# Patient Record
Sex: Female | Born: 1975 | Race: Black or African American | Hispanic: No | Marital: Single | Smoking: Never smoker
Health system: Southern US, Community
[De-identification: ages and names within clinical notes are randomized; demographics above are authoritative.]

## PROBLEM LIST (undated history)

## (undated) ENCOUNTER — Ambulatory Visit (HOSPITAL_COMMUNITY): Admission: EM | Payer: Self-pay

## (undated) DIAGNOSIS — B9562 Methicillin resistant Staphylococcus aureus infection as the cause of diseases classified elsewhere: Secondary | ICD-10-CM

## (undated) DIAGNOSIS — L03116 Cellulitis of left lower limb: Secondary | ICD-10-CM

## (undated) DIAGNOSIS — E7800 Pure hypercholesterolemia, unspecified: Secondary | ICD-10-CM

## (undated) DIAGNOSIS — T7840XA Allergy, unspecified, initial encounter: Secondary | ICD-10-CM

## (undated) DIAGNOSIS — G43909 Migraine, unspecified, not intractable, without status migrainosus: Secondary | ICD-10-CM

## (undated) DIAGNOSIS — R42 Dizziness and giddiness: Secondary | ICD-10-CM

## (undated) DIAGNOSIS — F410 Panic disorder [episodic paroxysmal anxiety] without agoraphobia: Secondary | ICD-10-CM

## (undated) HISTORY — PX: LEEP: SHX91

## (undated) HISTORY — PX: OTHER SURGICAL HISTORY: SHX169

## (undated) HISTORY — DX: Allergy, unspecified, initial encounter: T78.40XA

## (undated) HISTORY — DX: Migraine, unspecified, not intractable, without status migrainosus: G43.909

---

## 1999-05-03 ENCOUNTER — Inpatient Hospital Stay (HOSPITAL_COMMUNITY): Admission: AD | Admit: 1999-05-03 | Discharge: 1999-05-03 | Payer: Self-pay | Admitting: Obstetrics

## 1999-05-04 ENCOUNTER — Inpatient Hospital Stay (HOSPITAL_COMMUNITY): Admission: AD | Admit: 1999-05-04 | Discharge: 1999-05-04 | Payer: Self-pay | Admitting: *Deleted

## 1999-05-04 ENCOUNTER — Encounter: Payer: Self-pay | Admitting: Obstetrics

## 1999-05-04 ENCOUNTER — Encounter (INDEPENDENT_AMBULATORY_CARE_PROVIDER_SITE_OTHER): Payer: Self-pay | Admitting: Specialist

## 2000-09-02 ENCOUNTER — Inpatient Hospital Stay (HOSPITAL_COMMUNITY): Admission: AD | Admit: 2000-09-02 | Discharge: 2000-09-02 | Payer: Self-pay | Admitting: Obstetrics and Gynecology

## 2000-10-25 ENCOUNTER — Inpatient Hospital Stay (HOSPITAL_COMMUNITY): Admission: AD | Admit: 2000-10-25 | Discharge: 2000-10-28 | Payer: Self-pay | Admitting: Obstetrics & Gynecology

## 2000-11-30 ENCOUNTER — Other Ambulatory Visit: Admission: RE | Admit: 2000-11-30 | Discharge: 2000-11-30 | Payer: Self-pay | Admitting: Obstetrics & Gynecology

## 2001-06-09 ENCOUNTER — Encounter (INDEPENDENT_AMBULATORY_CARE_PROVIDER_SITE_OTHER): Payer: Self-pay | Admitting: Specialist

## 2001-06-09 ENCOUNTER — Ambulatory Visit (HOSPITAL_COMMUNITY): Admission: RE | Admit: 2001-06-09 | Discharge: 2001-06-09 | Payer: Self-pay | Admitting: Obstetrics & Gynecology

## 2002-04-12 ENCOUNTER — Other Ambulatory Visit: Admission: RE | Admit: 2002-04-12 | Discharge: 2002-04-12 | Payer: Self-pay | Admitting: Obstetrics & Gynecology

## 2002-09-01 ENCOUNTER — Emergency Department (HOSPITAL_COMMUNITY): Admission: EM | Admit: 2002-09-01 | Discharge: 2002-09-01 | Payer: Self-pay | Admitting: Emergency Medicine

## 2003-05-18 ENCOUNTER — Other Ambulatory Visit: Admission: RE | Admit: 2003-05-18 | Discharge: 2003-05-18 | Payer: Self-pay | Admitting: Obstetrics & Gynecology

## 2004-02-28 ENCOUNTER — Emergency Department (HOSPITAL_COMMUNITY): Admission: EM | Admit: 2004-02-28 | Discharge: 2004-02-28 | Payer: Self-pay | Admitting: Emergency Medicine

## 2006-01-25 ENCOUNTER — Emergency Department (HOSPITAL_COMMUNITY): Admission: EM | Admit: 2006-01-25 | Discharge: 2006-01-25 | Payer: Self-pay | Admitting: Emergency Medicine

## 2006-02-19 ENCOUNTER — Encounter (INDEPENDENT_AMBULATORY_CARE_PROVIDER_SITE_OTHER): Payer: Self-pay | Admitting: Specialist

## 2006-02-19 ENCOUNTER — Ambulatory Visit (HOSPITAL_COMMUNITY): Admission: RE | Admit: 2006-02-19 | Discharge: 2006-02-19 | Payer: Self-pay | Admitting: Obstetrics and Gynecology

## 2007-03-29 ENCOUNTER — Emergency Department (HOSPITAL_COMMUNITY): Admission: EM | Admit: 2007-03-29 | Discharge: 2007-03-29 | Payer: Self-pay | Admitting: Emergency Medicine

## 2007-05-18 ENCOUNTER — Ambulatory Visit: Payer: Self-pay | Admitting: Obstetrics and Gynecology

## 2007-05-18 ENCOUNTER — Other Ambulatory Visit: Admission: RE | Admit: 2007-05-18 | Discharge: 2007-05-18 | Payer: Self-pay | Admitting: Obstetrics and Gynecology

## 2007-09-08 ENCOUNTER — Emergency Department (HOSPITAL_COMMUNITY): Admission: EM | Admit: 2007-09-08 | Discharge: 2007-09-08 | Payer: Self-pay | Admitting: Emergency Medicine

## 2007-09-16 ENCOUNTER — Emergency Department (HOSPITAL_COMMUNITY): Admission: EM | Admit: 2007-09-16 | Discharge: 2007-09-16 | Payer: Self-pay | Admitting: Emergency Medicine

## 2007-09-19 ENCOUNTER — Emergency Department (HOSPITAL_COMMUNITY): Admission: EM | Admit: 2007-09-19 | Discharge: 2007-09-19 | Payer: Self-pay | Admitting: Family Medicine

## 2007-09-30 ENCOUNTER — Ambulatory Visit: Payer: Self-pay | Admitting: Family Medicine

## 2007-10-01 ENCOUNTER — Encounter: Payer: Self-pay | Admitting: Family Medicine

## 2007-11-17 ENCOUNTER — Ambulatory Visit: Payer: Self-pay | Admitting: Obstetrics and Gynecology

## 2007-11-17 ENCOUNTER — Encounter: Payer: Self-pay | Admitting: Family

## 2007-11-17 LAB — CONVERTED CEMR LAB: Yeast Wet Prep HPF POC: NONE SEEN

## 2007-12-08 ENCOUNTER — Ambulatory Visit: Payer: Self-pay | Admitting: Obstetrics & Gynecology

## 2008-08-02 ENCOUNTER — Emergency Department (HOSPITAL_COMMUNITY): Admission: EM | Admit: 2008-08-02 | Discharge: 2008-08-02 | Payer: Self-pay | Admitting: Emergency Medicine

## 2008-08-06 ENCOUNTER — Ambulatory Visit: Payer: Self-pay | Admitting: Internal Medicine

## 2008-08-10 ENCOUNTER — Emergency Department (HOSPITAL_COMMUNITY): Admission: EM | Admit: 2008-08-10 | Discharge: 2008-08-10 | Payer: Self-pay | Admitting: Emergency Medicine

## 2008-10-29 ENCOUNTER — Ambulatory Visit: Payer: Self-pay | Admitting: Internal Medicine

## 2008-10-30 ENCOUNTER — Telehealth (INDEPENDENT_AMBULATORY_CARE_PROVIDER_SITE_OTHER): Payer: Self-pay | Admitting: *Deleted

## 2008-10-31 ENCOUNTER — Ambulatory Visit: Payer: Self-pay | Admitting: Family Medicine

## 2008-11-01 ENCOUNTER — Encounter (INDEPENDENT_AMBULATORY_CARE_PROVIDER_SITE_OTHER): Payer: Self-pay | Admitting: Adult Health

## 2008-11-05 ENCOUNTER — Ambulatory Visit: Payer: Self-pay | Admitting: Internal Medicine

## 2009-02-21 ENCOUNTER — Ambulatory Visit: Payer: Self-pay | Admitting: Internal Medicine

## 2009-03-22 ENCOUNTER — Ambulatory Visit: Payer: Self-pay | Admitting: Family Medicine

## 2009-03-28 ENCOUNTER — Emergency Department (HOSPITAL_COMMUNITY): Admission: EM | Admit: 2009-03-28 | Discharge: 2009-03-28 | Payer: Self-pay | Admitting: Emergency Medicine

## 2009-05-06 ENCOUNTER — Ambulatory Visit: Payer: Self-pay | Admitting: Family Medicine

## 2009-05-06 ENCOUNTER — Encounter (INDEPENDENT_AMBULATORY_CARE_PROVIDER_SITE_OTHER): Payer: Self-pay | Admitting: Adult Health

## 2009-05-06 LAB — CONVERTED CEMR LAB
Basophils Relative: 0 % (ref 0–1)
Eosinophils Absolute: 0.4 10*3/uL (ref 0.0–0.7)
Eosinophils Relative: 5 % (ref 0–5)
HCT: 35.6 % — ABNORMAL LOW (ref 36.0–46.0)
Hemoglobin: 11.3 g/dL — ABNORMAL LOW (ref 12.0–15.0)
Lymphs Abs: 2.6 10*3/uL (ref 0.7–4.0)
MCHC: 31.7 g/dL (ref 30.0–36.0)
Neutro Abs: 4.2 10*3/uL (ref 1.7–7.7)
Neutrophils Relative %: 55 % (ref 43–77)
RDW: 13.4 % (ref 11.5–15.5)

## 2009-05-07 ENCOUNTER — Encounter (INDEPENDENT_AMBULATORY_CARE_PROVIDER_SITE_OTHER): Payer: Self-pay | Admitting: Adult Health

## 2009-05-14 ENCOUNTER — Inpatient Hospital Stay (HOSPITAL_COMMUNITY): Admission: EM | Admit: 2009-05-14 | Discharge: 2009-05-17 | Payer: Self-pay | Admitting: Emergency Medicine

## 2009-05-14 ENCOUNTER — Ambulatory Visit: Payer: Self-pay | Admitting: Internal Medicine

## 2009-05-23 ENCOUNTER — Ambulatory Visit: Payer: Self-pay | Admitting: Family Medicine

## 2009-09-23 ENCOUNTER — Emergency Department (HOSPITAL_COMMUNITY): Admission: EM | Admit: 2009-09-23 | Discharge: 2009-09-23 | Payer: Self-pay | Admitting: Emergency Medicine

## 2009-09-30 ENCOUNTER — Emergency Department (HOSPITAL_COMMUNITY): Admission: EM | Admit: 2009-09-30 | Discharge: 2009-09-30 | Payer: Self-pay | Admitting: Emergency Medicine

## 2009-10-02 ENCOUNTER — Inpatient Hospital Stay (HOSPITAL_COMMUNITY): Admission: EM | Admit: 2009-10-02 | Discharge: 2009-10-04 | Payer: Self-pay | Admitting: Emergency Medicine

## 2009-10-09 ENCOUNTER — Emergency Department (HOSPITAL_COMMUNITY): Admission: EM | Admit: 2009-10-09 | Discharge: 2009-10-09 | Payer: Self-pay | Admitting: Emergency Medicine

## 2010-03-20 LAB — COMPREHENSIVE METABOLIC PANEL
ALT: 13 U/L (ref 0–35)
AST: 17 U/L (ref 0–37)
Alkaline Phosphatase: 43 U/L (ref 39–117)
CO2: 26 mEq/L (ref 19–32)
Chloride: 101 mEq/L (ref 96–112)
GFR calc Af Amer: >60 mL/min (ref >60)
GFR calc non Af Amer: 54 mL/min — ABNORMAL LOW (ref >60)
Total Bilirubin: 0.6 mg/dL (ref 0.3–1.2)
Total Protein: 8 g/dL (ref 6.0–8.3)

## 2010-03-20 LAB — CULTURE, BLOOD (ROUTINE X 2)
Culture  Setup Time: 201109290353
Culture: NO GROWTH

## 2010-03-20 LAB — DIFFERENTIAL
Basophils Absolute: 0 10*3/uL (ref 0.0–0.1)
Basophils Absolute: 0 10*3/uL (ref 0.0–0.1)
Basophils Relative: 0 % (ref 0–1)
Basophils Relative: 0 % (ref 0–1)
Eosinophils Absolute: 0 10*3/uL (ref 0.0–0.7)
Eosinophils Relative: 0 % (ref 0–5)
Eosinophils Relative: 0 % (ref 0–5)
Lymphocytes Relative: 7 % — ABNORMAL LOW (ref 12–46)
Lymphocytes Relative: 8 % — ABNORMAL LOW (ref 12–46)
Monocytes Absolute: 0.1 10*3/uL (ref 0.1–1.0)
Monocytes Relative: 1 % — ABNORMAL LOW (ref 3–12)
Neutro Abs: 11.7 10*3/uL — ABNORMAL HIGH (ref 1.7–7.7)
Neutrophils Relative %: 91 % — ABNORMAL HIGH (ref 43–77)

## 2010-03-20 LAB — BASIC METABOLIC PANEL
Chloride: 100 mEq/L (ref 96–112)
GFR calc non Af Amer: 59 mL/min — ABNORMAL LOW (ref >60)
Potassium: 4.5 mEq/L (ref 3.5–5.1)

## 2010-03-20 LAB — CBC
Hemoglobin: 12.2 g/dL (ref 12.0–15.0)
MCH: 24.8 pg — ABNORMAL LOW (ref 26.0–34.0)
MCHC: 32.7 g/dL (ref 30.0–36.0)
MCV: 76.5 fL — ABNORMAL LOW (ref 78.0–100.0)
RBC: 5.16 MIL/uL — ABNORMAL HIGH (ref 3.87–5.11)
WBC: 13 10*3/uL — ABNORMAL HIGH (ref 4.0–10.5)

## 2010-03-20 LAB — MRSA PCR SCREENING: MRSA by PCR: NEGATIVE

## 2010-03-20 LAB — C1 ESTERASE INHIBITOR: C1INH SerPl-mCnc: 17 mg/dL (ref 11–26)

## 2010-03-25 LAB — BASIC METABOLIC PANEL
CO2: 26 mEq/L (ref 19–32)
CO2: 27 mEq/L (ref 19–32)
Calcium: 8.9 mg/dL (ref 8.4–10.5)
Chloride: 106 mEq/L (ref 96–112)
Creatinine, Ser: 0.85 mg/dL (ref 0.4–1.2)
Creatinine, Ser: 1.01 mg/dL (ref 0.4–1.2)
GFR calc Af Amer: >60 mL/min (ref >60)
GFR calc non Af Amer: >60 mL/min (ref >60)
Glucose, Bld: 95 mg/dL (ref 70–99)
Potassium: 4.1 mEq/L (ref 3.5–5.1)
Sodium: 135 mEq/L (ref 135–145)
Sodium: 139 mEq/L (ref 135–145)

## 2010-03-25 LAB — DIFFERENTIAL
Basophils Relative: 0 % (ref 0–1)
Lymphocytes Relative: 28 % (ref 12–46)
Monocytes Relative: 7 % (ref 3–12)
Neutro Abs: 3.8 10*3/uL (ref 1.7–7.7)

## 2010-03-25 LAB — CBC
Hemoglobin: 10.8 g/dL — ABNORMAL LOW (ref 12.0–15.0)
MCHC: 31.8 g/dL (ref 30.0–36.0)
MCV: 76.8 fL — ABNORMAL LOW (ref 78.0–100.0)
MCV: 77.9 fL — ABNORMAL LOW (ref 78.0–100.0)
Platelets: 253 10*3/uL (ref 150–400)
Platelets: 306 10*3/uL (ref 150–400)
RBC: 4.42 MIL/uL (ref 3.87–5.11)
RDW: 13.8 % (ref 11.5–15.5)
WBC: 5.4 10*3/uL (ref 4.0–10.5)

## 2010-03-25 LAB — POCT I-STAT, CHEM 8
Calcium, Ion: 0.98 mmol/L — ABNORMAL LOW (ref 1.12–1.32)
Chloride: 107 mEq/L (ref 96–112)
HCT: 44 % (ref 36.0–46.0)
Hemoglobin: 15 g/dL (ref 12.0–15.0)
Potassium: 5 mEq/L (ref 3.5–5.1)
TCO2: 20 mmol/L (ref 0–100)

## 2010-03-25 LAB — WOUND CULTURE

## 2010-03-28 DIAGNOSIS — G4700 Insomnia, unspecified: Secondary | ICD-10-CM | POA: Insufficient documentation

## 2010-04-12 LAB — COMPREHENSIVE METABOLIC PANEL
ALT: 12 U/L (ref 0–35)
Alkaline Phosphatase: 44 U/L (ref 39–117)
BUN: 5 mg/dL — ABNORMAL LOW (ref 6–23)
CO2: 24 mEq/L (ref 19–32)
Chloride: 106 mEq/L (ref 96–112)
Glucose, Bld: 115 mg/dL — ABNORMAL HIGH (ref 70–99)
Potassium: 3.7 mEq/L (ref 3.5–5.1)
Sodium: 137 mEq/L (ref 135–145)
Total Bilirubin: 0.7 mg/dL (ref 0.3–1.2)
Total Protein: 7.6 g/dL (ref 6.0–8.3)

## 2010-04-12 LAB — DIFFERENTIAL
Basophils Absolute: 0 10*3/uL (ref 0.0–0.1)
Basophils Relative: 1 % (ref 0–1)
Eosinophils Absolute: 0.1 10*3/uL (ref 0.0–0.7)
Lymphs Abs: 2.4 10*3/uL (ref 0.7–4.0)
Monocytes Relative: 5 % (ref 3–12)
Neutro Abs: 4 10*3/uL (ref 1.7–7.7)
Neutrophils Relative %: 58 % (ref 43–77)

## 2010-04-12 LAB — CBC
MCV: 76.6 fL — ABNORMAL LOW (ref 78.0–100.0)
RBC: 4.79 MIL/uL (ref 3.87–5.11)
RDW: 14.1 % (ref 11.5–15.5)
WBC: 6.9 10*3/uL (ref 4.0–10.5)

## 2010-04-12 LAB — POCT CARDIAC MARKERS: Myoglobin, poc: 44 ng/mL (ref 12–200)

## 2010-05-10 ENCOUNTER — Emergency Department (HOSPITAL_BASED_OUTPATIENT_CLINIC_OR_DEPARTMENT_OTHER)
Admission: EM | Admit: 2010-05-10 | Discharge: 2010-05-10 | Disposition: A | Discharge status: Home / Self Care | Payer: Medicaid Other | Attending: Emergency Medicine | Admitting: Emergency Medicine

## 2010-05-10 DIAGNOSIS — L02619 Cutaneous abscess of unspecified foot: Secondary | ICD-10-CM | POA: Insufficient documentation

## 2010-05-10 DIAGNOSIS — M79609 Pain in unspecified limb: Secondary | ICD-10-CM | POA: Insufficient documentation

## 2010-05-10 DIAGNOSIS — L03119 Cellulitis of unspecified part of limb: Secondary | ICD-10-CM | POA: Insufficient documentation

## 2010-05-20 NOTE — Group Therapy Note (Signed)
Jasmine Jennings, Jasmine Jennings              ACCOUNT NO.:  [id number]   MEDICAL RECORD NO.:  [id number]          PATIENT TYPE:  WOC   LOCATION:  [facility]                   FACILITY:  [facility]   PHYSICIAN:  Sid Falcon, CNM  DATE OF BIRTH:  1975/12/09   DATE OF SERVICE:  11/17/2007                                  CLINIC NOTE   The patient is here for followup after abnormal Pap smear in the past,  was seen at [facility] ob[facility]gyn, had a LEEP procedure done in 2008 with a  followup high-grade squamous intraepithelial lesion, she had a high-  grade squamous intraepithelial lesion in early 2008 followed by a LEEP  procedure.  The patient then went on to have a conization on February  2008.   DICTATION ENDS AT THIS POINT      Sid Falcon, [place]     WM/MEDQ  D:  11/17/2007  T:  11/17/2007  Job:  [id number]

## 2010-05-20 NOTE — Group Therapy Note (Signed)
Jasmine Jennings, Jasmine Jennings              ACCOUNT NO.:  [id number]   MEDICAL RECORD NO.:  [id number]          PATIENT TYPE:  WOC   LOCATION:  [facility]                   FACILITY:  [facility]   PHYSICIAN:  Sid Falcon, CNM  DATE OF BIRTH:  Feb 27, 1975   DATE OF SERVICE:  11/17/2007                                  CLINIC NOTE   The patient was formerly seen at  [facility].  The patient  had a canonization done in February 22, 2006 for high-grade squamous  intra-epithelial lesion.  The patient then followed up and had a repeat  Pap in February 2009.  It was ASCUS with a positive HPV reflex.  The  patient was then seen at our [facility] and she had a  colposcopy on May 18, 2007.  The colpo result came back as  unsatisfactory.  The patient was informed to come back for a repeat Pap  smear in 6 months.  The patient is here today for that repeat Pap smear.  Denying any additional problems or concerns.  The patient is receiving  Depo-Provera for birth control method which is due tomorrow and would  like to get it here if possible.   EXAM:  The cervix was easy to find.  Cervical os was patent.  There was  some abnormal scarring in the anterior portion of the cervix at  approximately 12 o'clock.  Pap smear was obtained without difficulty,  will be sent to lab.  In addition, there was some white frothy appearing  discharge.  A Wet prep was collected and GC and Chlamydia will be run  along with a Pap smear that was obtained today.   ASSESSMENT:  Abnormal Pap smear with unsatisfactory colposcopy.   PLAN:  A Pap smear to laboratory with wet prep chlamydia and gonorrhea.  The patient will call the health department today to obtain verification  of the last Depo injection and we will proceed with injection if dates  are accurate.  The patient will follow up in 2 weeks for results.      Sid Falcon, CNM     WM/MEDQ  D:  11/17/2007  T:  11/17/2007  Job:  [id number]

## 2010-05-23 NOTE — Op Note (Signed)
NAMEKABELLA, Jasmine Jennings              ACCOUNT NO.:  [id number]   MEDICAL RECORD NO.:  [id number]          PATIENT TYPE:  AMB   LOCATION:  [facility]                           [facility]ILITY:  [facility]   PHYSICIAN:  Naima A. Dillard, M.D. DATE OF BIRTH:  Mar 10, 1975   DATE OF PROCEDURE:  02/19/2006  DATE OF DISCHARGE:                               OPERATIVE REPORT   PREOPERATIVE DIAGNOSIS:  High grade squamous epithelial lesion on Pap  with colposcopic biopsy that was negative.   POSTOPERATIVE DIAGNOSIS:  High grade squamous epithelial lesion on Pap  with colposcopic biopsy that was negative.   PROCEDURE:  Loop electrosurgical excision procedure.   SURGEON:  Naima A. Dillard, M.D.   ASSISTANT:  None.   ANESTHESIA:  MAC.   SPECIMENS:  LEEP specimen.   ESTIMATED BLOOD LOSS:  Minimal.   COMPLICATIONS:  None.   DISPOSITION:  The patient went to the PACU in stable condition.   PROCEDURE IN DETAIL:  The patient was taken to the operating room where  she was given anesthesia, prepped and draped in a normal sterile  fashion.  A bivalve speculum was placed into the vagina.  The anterior  lip of the cervix was grasped with a single tooth tenaculum.  20 mL of  2% Xylocaine plain was placed for cervical block.  Lugol's was placed on  the cervix, there were no lightened areas.  A small loop was used and  LEEP was done on 60 cut.  The cervical bed was made hemostatic with  Bovie cautery and Lugol's.  All instruments were removed from the  vagina.  The tenaculum site was noted to be hemostatic.  Sponge, lap,  and needle counts were correct.  The patient went to the recovery room  in stable condition.      Naima A. Normand Sloop, M.D.  Electronically Signed     NAD/MEDQ  D:  02/19/2006  T:  02/19/2006  Job:  [id number]

## 2010-05-23 NOTE — Op Note (Signed)
[facility]  Patient:    Jasmine Jennings, Jasmine Jennings Visit Number: [id number] MRN: [id number]          Service Type: DSU Location: [facility] Attending Physician:  Genia Del Dictated by:   Genia Del, M.D. Proc. Date: 06/09/01 Admit Date:  06/09/2001 Discharge Date: 06/09/2001                             Operative Report  DATE OF BIRTH:                [birth year]  PREOPERATIVE DIAGNOSIS:       7+ weeks gestation, nondesired.  POSTOPERATIVE DIAGNOSIS:      7+ weeks gestation, nondesired.  OPERATION:                    Dilatation and evacuation.  SURGEON:                      Genia Del, M.D.  ANESTHESIOLOGIST:             Dr. Jean Rosenthal.   DESCRIPTION OF PROCEDURE:     Under MAC analgesia, the patient is in lithotomy position. She is prepped with Hibiclens on the suprapubic, vulvar, and vaginal areas and draped as usual. The bladder is catheterized. The vaginal exam reveals a very anteverted uterus, corresponding to seven to eight weeks, mobile, no adnexal mass. The cervix is long and closed. The speculum is introduced. We noticed increased vaginal secretions compatible with Candida vaginitis. The prep is completed. With the speculum in place, we then proceeded with paracervical block with lidocaine 1% 20 cc total at four and eight oclock. The anterior lip of the cervix is grasped with the tenaculum. Dilatation of the cervix with Hegar dilators up to #33 without difficulty. We then used a #8 curved curette for suction. The intrauterine cavity is suctioned. Products of conception corresponding to about seven to eight weeks are brought out and sent to pathology. We then used a sharp curette to curettage all surfaces of the intrauterine cavity. The uterine sound is heard all over. We then used the suction curette. No more products of conception are brought back. Hemostasis is adequate. We removed the instruments. Hemostasis is  completed on the anterior lip of the cervix at the site of the tenaculum with silver nitrate. We then removed the speculum. The uterus is well contracted. Estimated blood loss was less than 50 cc. No complication occurred. The patients blood group is O-positive. The patient is brought to the recovery room in good status. Dictated by:   Genia Del, M.D. Attending Physician:  Genia Del DD:  06/09/01 TD:  06/13/01 Job: [id number]

## 2010-05-23 NOTE — H&P (Signed)
Jasmine Jennings, Jasmine Jennings              ACCOUNT NO.:  [id number]   MEDICAL RECORD NO.:  [id number]          PATIENT TYPE:  AMB   LOCATION:  [facility]                           [facility]   PHYSICIAN:  Naima A. Dillard, M.D. DATE OF BIRTH:  01/15/1975   DATE OF ADMISSION:  DATE OF DISCHARGE:                              HISTORY & PHYSICAL   CHIEF COMPLAINT:  High grade squamous epithelial lesion on Pap smear  with negative colposcopy biopsy, patient for LEEP.   The patient is a 35 year old [place] American female, gravida 1, para 1,  who presented to me on December 28, 2005, for colposcopy with a high  grade squamous epithelial lesion on her Pap smear.  The patient was  without complaints.  She has no past significant history.   ALLERGIES:  No known drug allergies.   SOCIAL HISTORY:  Negative for tobacco, alcohol, or drug use.   FAMILY HISTORY:  No family history of GYN cancer.   PAST SURGICAL HISTORY:  Unremarkable.   PAST OB HISTORY:  Significant for a vaginal delivery x1 without any  complications.   PHYSICAL EXAMINATION:  VITAL SIGNS:  Blood pressure 100/60, weight 135.  HEENT:  Pupils are equal.  Hearing is normal.  Throat is clear.  LUNGS:  Clear to auscultation bilaterally.  HEART:  Regular rate and rhythm.  BACK:  No CVA tenderness.  ABDOMEN:  Nontender without any  masses or organomegaly.  EXTREMITIES:  No cyanosis, clubbing, or edema.  GU:  Vaginal exam is within normal limits.  Cervix is nontender without  any gross lesions.  She has some acetowhite changes seen at 12 and 6  o'clock.  Uterus is normal shape and size, mobile, nontender.  Adnexa  has no masses and nontender.   LABORATORY DATA:  Cervical biopsy at 12 o'clock was significant for  acute on chronic cervicitis.  Cervical biopsy at 6 was benign squamous  mucosa.  Endocervical curettings was benign.   I reviewed the Pap smear with Dr. Lorin Picket who felt like it was definitely  a high grade lesion seen on the Pap  smear.   ASSESSMENT:  Discrepancy between Pap smear and colposcopic biopsy.   PLAN:  LEEP.  The patient understands the risks are but not limited to  bleeding, infection, cervical stenosis and cervical incompetence during  pregnancy.  The patient agrees with the procedure.      Naima A. Normand Sloop, M.D.  Electronically Signed     NAD/MEDQ  D:  02/18/2006  T:  02/18/2006  Job:  [id number]

## 2010-05-23 NOTE — H&P (Signed)
[facility]  Patient:    Jasmine Jennings, Jasmine Jennings Visit Number: [id number] MRN: [id number]          Service Type: OBS Location: 910A [id number] [id number] Attending Physician:  Lenoard Aden Dictated by:   Lenoard Aden, M.D. Admit Date:  10/25/2000 Discharge Date: 10/28/2000   CC:         [facility]                         History and Physical  CHIEF COMPLAINT:              Oligohydramnios and intrauterine growth retardation, for induction.  HISTORY OF PRESENT ILLNESS:   This is a 35 year old African-American female, G2 P2, EDD November 11, 2000, at 37+ weeks, for induction due to worsening oligohydramnios and asymmetric IUGR.  PAST MEDICAL HISTORY:         1. Migraines.                               2. History of SAB without D&C in May 2001.  FAMILY HISTORY:               Insulin-dependent diabetes.  No other contributory medical history.  SOCIAL HISTORY:               Noncontributory.  Nonsmoker, nondrinker.  Denies domestic or physical violence.  PRENATAL LABORATORY DATA:     Blood type O-positive.  RH antibody negative. Rubella immune.  Hepatitis B surface antigen negative.  HIV nonreactive.  VDRL negative.  GBS was not performed in reviewing of records.  PREGNANCY HISTORY:            Complicated by preterm cervical change, stable on bed rest, and IUGR as noted.  PHYSICAL EXAMINATION:  GENERAL:                      She is a well-developed, well-nourished black female, in no apparent distress.  HEENT:                        Normal.  LUNGS:                        Clear.  HEART:                        Regular rate and rhythm.  ABDOMEN:                      Soft, gravid, nontender.  Estimated fetal weight 5-1/2 pounds.  PELVIC:                       Cervix per nursing fingertip, 60%, -1. Posterior Cervidil placed.  NST reactive.  Contractions mild every four to six minutes.  IMPRESSION:                   1. Patient with intrauterine  pregnancy.                               2. Group B streptococci positive.  3. Asymmetric intrauterine growth retardation                                  with oligohydramnios.  PLAN:                         Proceed with cervical ripening and induction. Is on prophylaxis antepartum. Dictated by:   Lenoard Aden, M.D. Attending Physician:  Lenoard Aden DD:  10/25/00 TD:  10/25/00 Job: [id number]

## 2010-06-08 IMAGING — CR DG TIBIA/FIBULA 2V*R*
4 series · 4 of 4 positions shown · non-contrast
Comparison: None.

CLINICAL DATA: Cellulitis.

RIGHT TIBIA AND FIBULA - 2 VIEW

[t tib/fib ap right (1 of 2)]
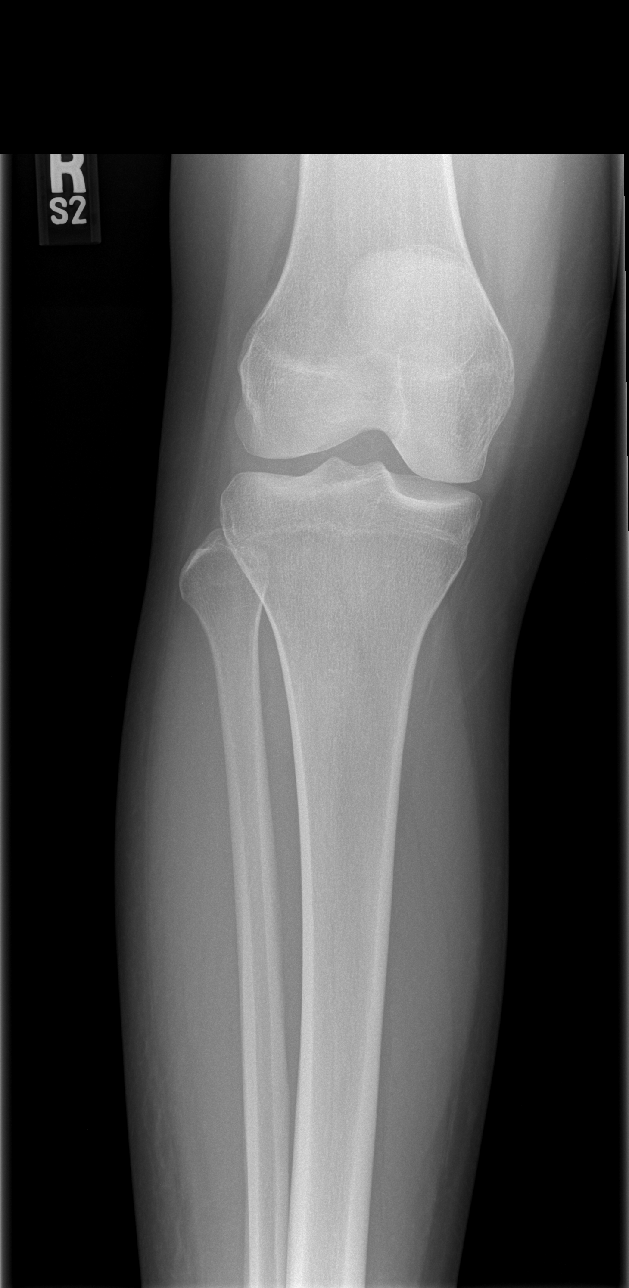

[t tib/fib ap right (2 of 2)]
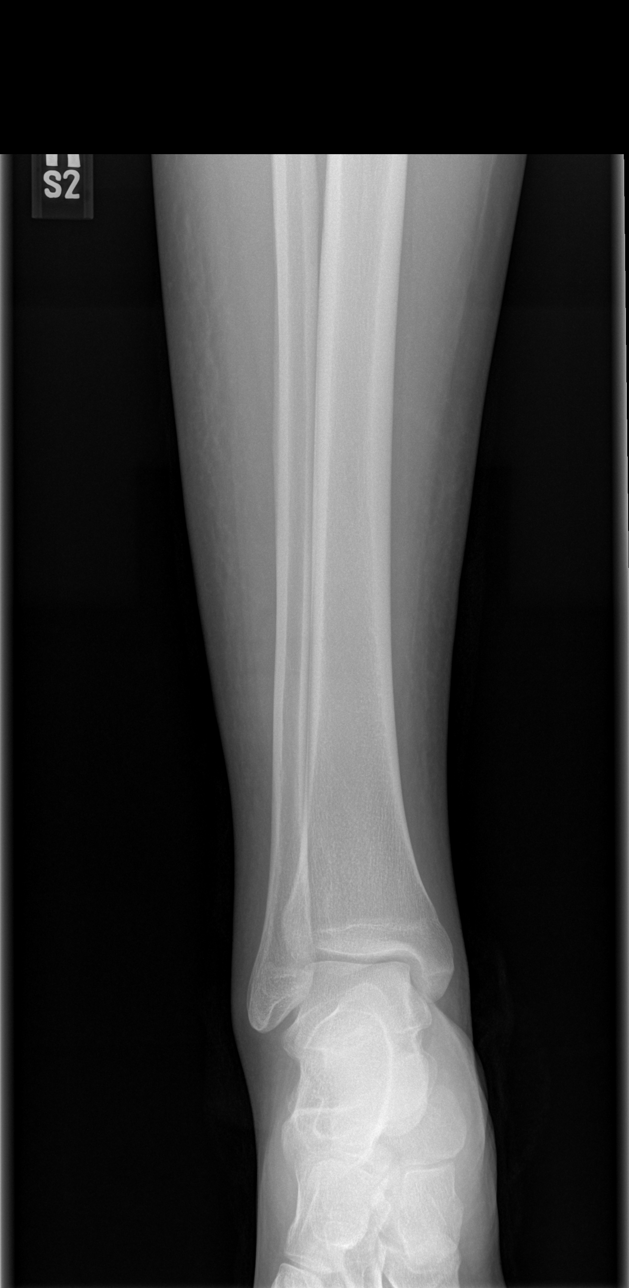

[t tib/fib lat right (1 of 2)]
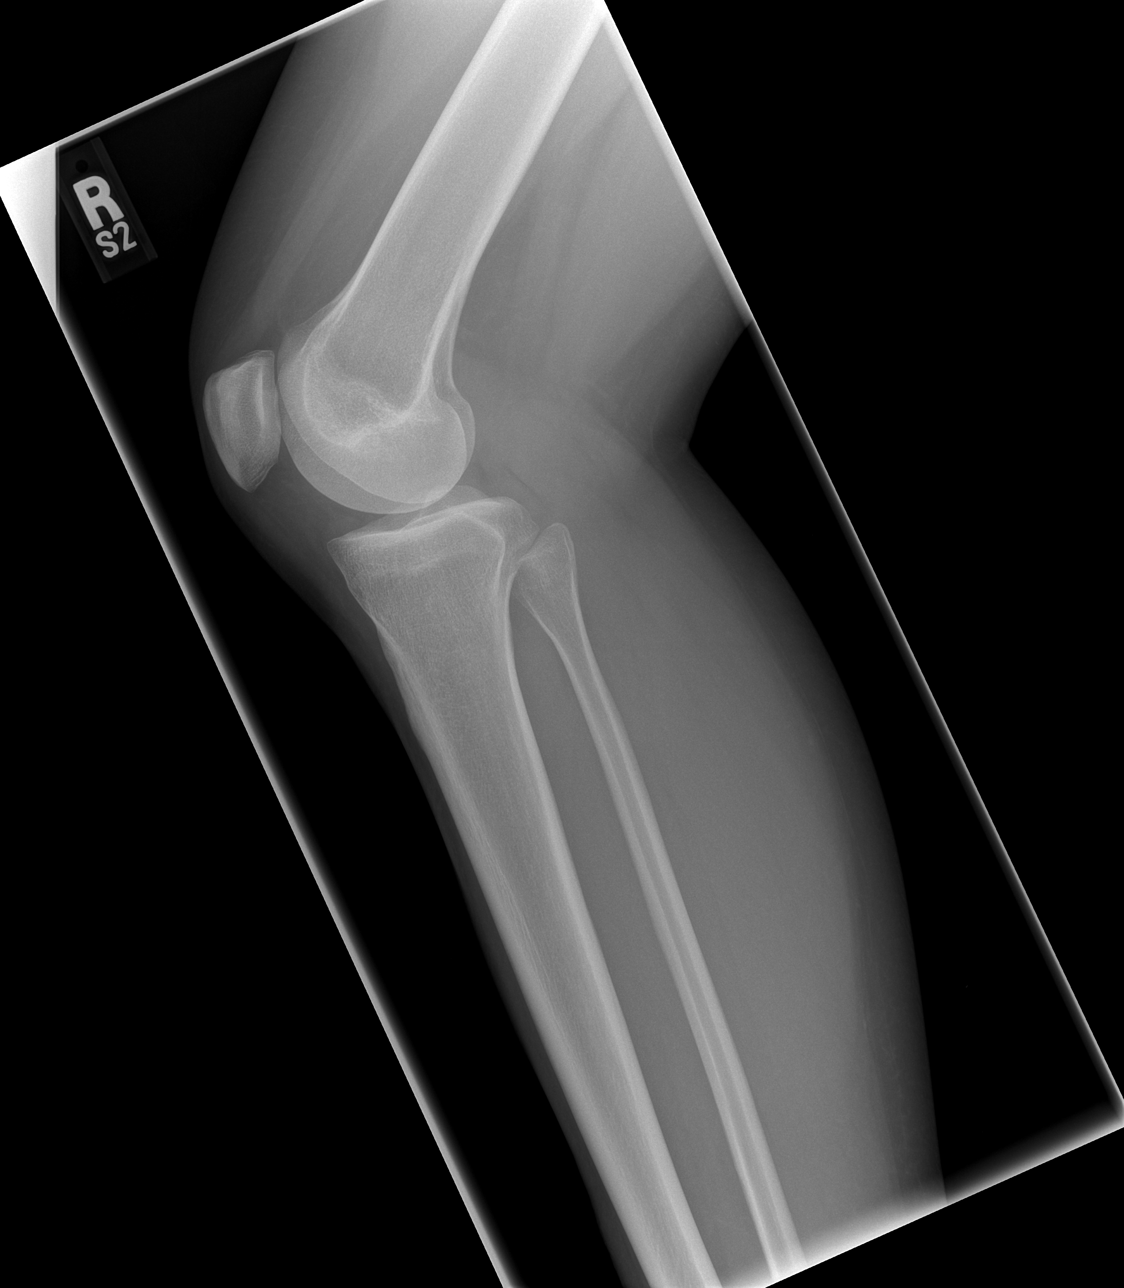

[t tib/fib lat right (2 of 2)]
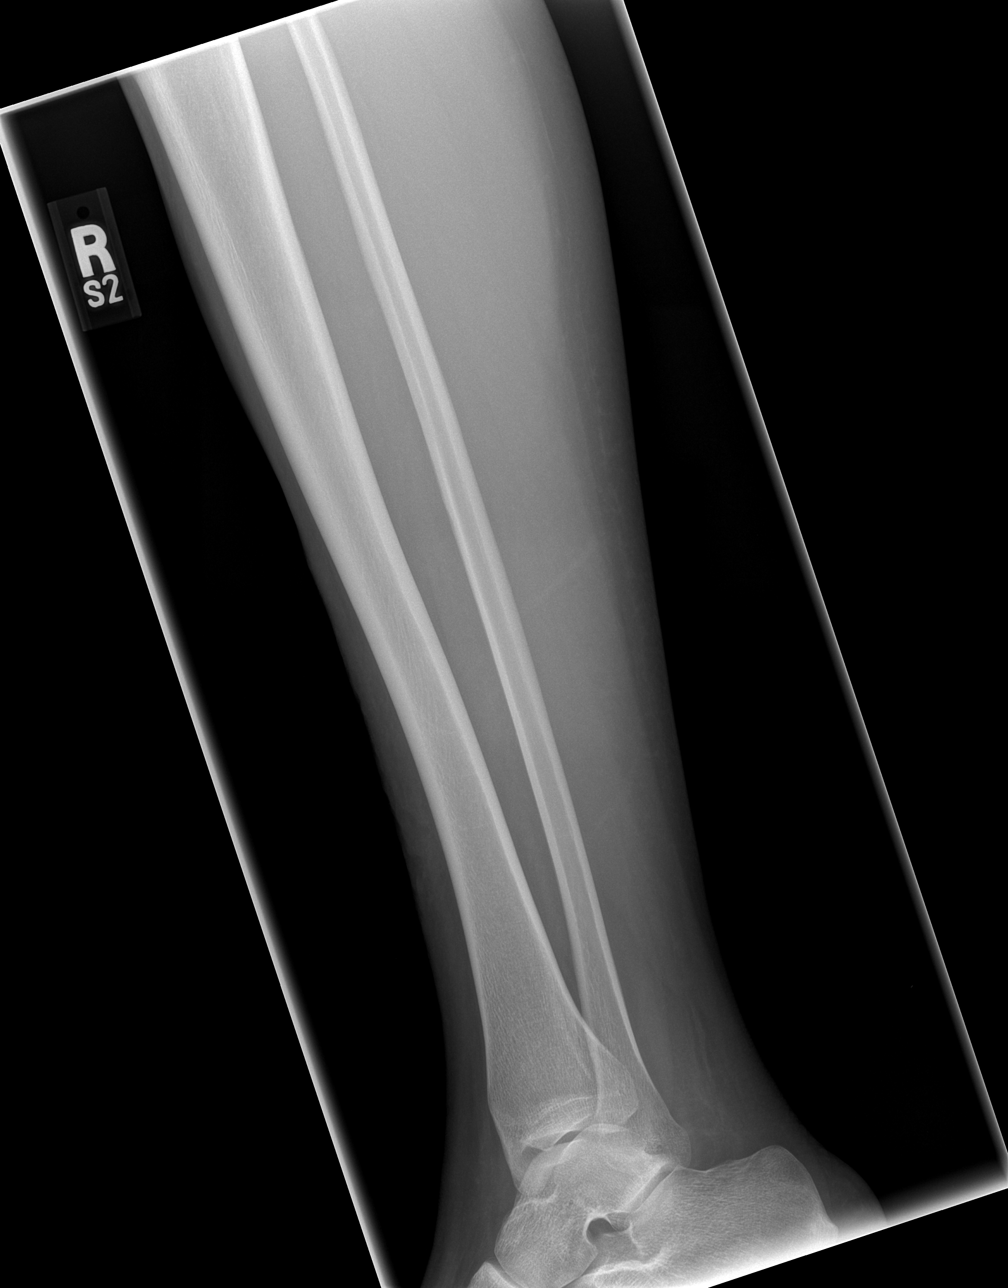

[4 of 4 positions shown; findings below may reference images not displayed]

FINDINGS: No acute bony abnormality.  No periosteal reaction.
Reticulated like appearance of the subcutaneous tissues mainly in
the lateral aspect of the lower leg.  This may be due to the
cellulitis/edema.
IMPRESSION: Findings consistent with cellulitis.  No bony abnormality.

## 2010-09-12 ENCOUNTER — Emergency Department (HOSPITAL_COMMUNITY)
Admission: EM | Admit: 2010-09-12 | Discharge: 2010-09-12 | Disposition: A | Discharge status: Home / Self Care | Payer: Medicaid Other | Attending: Emergency Medicine | Admitting: Emergency Medicine

## 2010-09-12 DIAGNOSIS — L03019 Cellulitis of unspecified finger: Secondary | ICD-10-CM | POA: Insufficient documentation

## 2010-09-12 DIAGNOSIS — Z23 Encounter for immunization: Secondary | ICD-10-CM | POA: Insufficient documentation

## 2011-06-18 ENCOUNTER — Other Ambulatory Visit: Payer: Self-pay | Admitting: Family Medicine

## 2011-06-18 DIAGNOSIS — N644 Mastodynia: Secondary | ICD-10-CM

## 2011-06-30 DIAGNOSIS — IMO0002 Reserved for concepts with insufficient information to code with codable children: Secondary | ICD-10-CM | POA: Insufficient documentation

## 2011-08-02 ENCOUNTER — Encounter (HOSPITAL_COMMUNITY): Payer: Self-pay | Admitting: *Deleted

## 2011-08-02 DIAGNOSIS — E860 Dehydration: Secondary | ICD-10-CM | POA: Insufficient documentation

## 2011-08-02 DIAGNOSIS — T675XXA Heat exhaustion, unspecified, initial encounter: Secondary | ICD-10-CM | POA: Insufficient documentation

## 2011-08-02 DIAGNOSIS — X30XXXA Exposure to excessive natural heat, initial encounter: Secondary | ICD-10-CM | POA: Insufficient documentation

## 2011-08-02 DIAGNOSIS — Y92009 Unspecified place in unspecified non-institutional (private) residence as the place of occurrence of the external cause: Secondary | ICD-10-CM | POA: Insufficient documentation

## 2011-08-02 LAB — POCT I-STAT, CHEM 8
BUN: <3 mg/dL — ABNORMAL LOW (ref 6–23)
Calcium, Ion: 1.18 mmol/L (ref 1.12–1.23)
Chloride: 105 mEq/L (ref 96–112)
Creatinine, Ser: 1.1 mg/dL (ref 0.50–1.10)
Glucose, Bld: 108 mg/dL — ABNORMAL HIGH (ref 70–99)

## 2011-08-02 LAB — CBC WITH DIFFERENTIAL/PLATELET
Eosinophils Absolute: 0.2 10*3/uL (ref 0.0–0.7)
Eosinophils Relative: 3 % (ref 0–5)
HCT: 36.5 % (ref 36.0–46.0)
Lymphocytes Relative: 41 % (ref 12–46)
Lymphs Abs: 2.2 10*3/uL (ref 0.7–4.0)
MCH: 24 pg — ABNORMAL LOW (ref 26.0–34.0)
MCV: 73 fL — ABNORMAL LOW (ref 78.0–100.0)
Monocytes Absolute: 0.4 10*3/uL (ref 0.1–1.0)
Monocytes Relative: 8 % (ref 3–12)
RBC: 5 MIL/uL (ref 3.87–5.11)
WBC: 5.3 10*3/uL (ref 4.0–10.5)

## 2011-08-02 LAB — URINE MICROSCOPIC-ADD ON

## 2011-08-02 LAB — URINALYSIS, ROUTINE W REFLEX MICROSCOPIC
Bilirubin Urine: NEGATIVE
Glucose, UA: NEGATIVE mg/dL
Protein, ur: NEGATIVE mg/dL
Specific Gravity, Urine: 1.01 (ref 1.005–1.030)

## 2011-08-02 NOTE — ED Notes (Signed)
C/o nv, weak, bilateral ear fullness & dizziness. Also reports decreased PO intake(liquid and food). Last void today ("normal"). Sx onset yesterday. Reports having been moving into a new town home which has no AC. "Concerned about heat exhaustion". VSS/WNL.

## 2011-08-03 ENCOUNTER — Emergency Department (HOSPITAL_COMMUNITY)
Admission: EM | Admit: 2011-08-03 | Discharge: 2011-08-03 | Disposition: A | Discharge status: Home / Self Care | Payer: 59 | Attending: Emergency Medicine | Admitting: Emergency Medicine

## 2011-08-03 DIAGNOSIS — T679XXA Effect of heat and light, unspecified, initial encounter: Secondary | ICD-10-CM

## 2011-08-03 DIAGNOSIS — E860 Dehydration: Secondary | ICD-10-CM

## 2011-08-03 HISTORY — DX: Methicillin resistant Staphylococcus aureus infection as the cause of diseases classified elsewhere: B95.62

## 2011-08-03 HISTORY — DX: Cellulitis of left lower limb: L03.116

## 2011-08-03 MED ORDER — SODIUM CHLORIDE 0.9 % IV BOLUS (SEPSIS)
1000.0000 mL | Freq: Once | INTRAVENOUS | Status: AC
  Administered 2011-08-03: 1000 mL via INTRAVENOUS

## 2011-08-03 MED ORDER — ONDANSETRON HCL 4 MG/2ML IJ SOLN
4.0000 mg | Freq: Once | INTRAMUSCULAR | Status: AC
  Administered 2011-08-03: 4 mg via INTRAVENOUS
  Filled 2011-08-03: qty 2

## 2011-08-03 MED ORDER — ONDANSETRON HCL 4 MG PO TABS: 4.0000 mg | ORAL_TABLET | Freq: Four times a day (QID) | ORAL | Status: AC

## 2011-08-03 NOTE — ED Provider Notes (Signed)
History     CSN: [id number]  Arrival date & time 08/02/11  2149   First MD Initiated Contact with Patient 08/03/11 0035      Chief Complaint  Patient presents with  . Emesis    (Consider location/radiation/quality/duration/timing/severity/associated sxs/prior treatment) HPI 36 year-old female presents to emergency room complaining of 2 days of nausea one day of vomiting, headache and generalized weakness. Patient noted a new home over the weekend, and has been without air conditioning. She has been drinking fluids but streaking more sodas than water. She is unclear should her urine has changed color. She denies any abdominal pain, fever. Sr. has been staying with her as a generalized weakness. Patient's concern for heat exhaustion. She recently diagnosed with MRSA, and has been unable to take her Bactrim for last 2 days due to nausea. She also complaining of fullness in her ears and slight vertigo, has had history of this before. No ear pain, no drainage. No recent URIs. Past Medical History  Diagnosis Date  . MRSA cellulitis of left foot     L foot    Past Surgical History  Procedure Date  . Leep     Family History  Problem Relation Age of Onset  . Hyperlipidemia Mother   . Diabetes Other     History  Substance Use Topics  . Smoking status: Never Smoker   . Smokeless tobacco: Not on file  . Alcohol Use: No    OB History    Grav Para Term Preterm Abortions TAB SAB Ect Mult Living                  Review of Systems  All other systems reviewed and are negative.  other than listed in HPI  Allergies  Doxycycline; Latex; Shellfish allergy; Yeast-related products; and Ciprofloxacin  Home Medications   Current Outpatient Rx  Name Route Sig Dispense Refill  . ALPRAZOLAM 0.5 MG PO TABS Oral Take 0.5 mg by mouth at bedtime as needed. For sleep and anxiety    . SULFAMETHOXAZOLE-TRIMETHOPRIM 400-80 MG PO TABS Oral Take 2 tablets by mouth 2 (two) times daily. For MRSA  infection    . ZOLPIDEM TARTRATE 10 MG PO TABS Oral Take 5 mg by mouth at bedtime as needed. For sleep      BP 124/69  Pulse 91  Temp 98.3 F (36.8 C) (Oral)  Resp 20  SpO2 100%  LMP 07/31/2011  Physical Exam  Nursing note and vitals reviewed. Constitutional: She is oriented to person, place, and time. She appears well-developed and well-nourished.  HENT:  Head: Normocephalic and atraumatic.  Right Ear: External ear normal.  Left Ear: External ear normal.  Nose: Nose normal.  Mouth/Throat: Oropharynx is clear and moist.  Eyes: Conjunctivae and EOM are normal. Pupils are equal, round, and reactive to light.  Neck: Normal range of motion. Neck supple. No JVD present. No tracheal deviation present. No thyromegaly present.  Cardiovascular: Normal rate, regular rhythm, normal heart sounds and intact distal pulses.  Exam reveals no gallop and no friction rub.   No murmur heard. Pulmonary/Chest: Effort normal and breath sounds normal. No stridor. No respiratory distress. She has no wheezes. She has no rales. She exhibits no tenderness.  Abdominal: Soft. Bowel sounds are normal. She exhibits no distension and no mass. There is no tenderness. There is no rebound and no guarding.  Musculoskeletal: Normal range of motion. She exhibits no edema and no tenderness.  Lymphadenopathy:    She has no  cervical adenopathy.  Neurological: She is alert and oriented to person, place, and time. She exhibits normal muscle tone. Coordination normal.  Skin: Skin is dry. No rash noted. No erythema. No pallor.  Psychiatric: She has a normal mood and affect. Her behavior is normal. Judgment and thought content normal.    ED Course  Procedures (including critical care time)  Labs Reviewed  URINALYSIS, ROUTINE W REFLEX MICROSCOPIC - Abnormal; Notable for the following:    APPearance CLOUDY (*)     Hgb urine dipstick LARGE (*)     Ketones, ur >80 (*)     Leukocytes, UA SMALL (*)     All other components  within normal limits  CBC WITH DIFFERENTIAL - Abnormal; Notable for the following:    MCV 73.0 (*)     MCH 24.0 (*)     All other components within normal limits  POCT I-STAT, CHEM 8 - Abnormal; Notable for the following:    BUN <3 (*)     Glucose, Bld 108 (*)     All other components within normal limits  URINE MICROSCOPIC-ADD ON - Abnormal; Notable for the following:    Squamous Epithelial / LPF FEW (*)     Bacteria, UA MANY (*)     All other components within normal limits  POCT PREGNANCY, URINE     1. Dehydration   2. Heat effect       MDM  36 year old female with probable heat exhaustion. Patient noted to have significant ketones in her urine, lab work otherwise unremarkable. She is currently on her menstrual period. Patient have Zofran and IV fluids. We'll reassess after fluids to see if she is able to take by mouth fluids and be able to go home to        Olivia Mackie, MD 08/04/11 [phone]

## 2011-08-10 ENCOUNTER — Other Ambulatory Visit: Payer: Self-pay | Admitting: Obstetrics and Gynecology

## 2011-10-07 DIAGNOSIS — R8769 Abnormal cytological findings in specimens from other female genital organs: Secondary | ICD-10-CM | POA: Insufficient documentation

## 2011-10-12 ENCOUNTER — Ambulatory Visit: Payer: 59

## 2011-10-12 ENCOUNTER — Ambulatory Visit (INDEPENDENT_AMBULATORY_CARE_PROVIDER_SITE_OTHER): Payer: 59 | Admitting: Family Medicine

## 2011-10-12 VITALS — BP 110/66 | HR 90 | Temp 98.2°F | Resp 16 | Ht 68.0 in | Wt 165.0 lb

## 2011-10-12 DIAGNOSIS — R0781 Pleurodynia: Secondary | ICD-10-CM

## 2011-10-12 DIAGNOSIS — R079 Chest pain, unspecified: Secondary | ICD-10-CM

## 2011-10-12 DIAGNOSIS — M545 Low back pain, unspecified: Secondary | ICD-10-CM

## 2011-10-12 MED ORDER — TRAMADOL HCL 50 MG PO TABS: 50.0000 mg | ORAL_TABLET | Freq: Three times a day (TID) | ORAL | Status: DC | PRN

## 2011-10-12 MED ORDER — DICLOFENAC SODIUM 75 MG PO TBEC: 75.0000 mg | DELAYED_RELEASE_TABLET | Freq: Two times a day (BID) | ORAL | Status: DC

## 2011-10-12 MED ORDER — METAXALONE 800 MG PO TABS: ORAL_TABLET | ORAL | Status: DC

## 2011-10-12 NOTE — Progress Notes (Signed)
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 He does not know of any specific injury to her back, however for the past 2-3 weeks she's been having pretty severe pain in her left low back. It hurts her when she lays down it hurts when she rolls over it hurts her when she needs doing things upright. Her daughter, and there were swollen this morning. She has been her primary care doctor and gotten a urinalysis which was concentrated sure that she was on the dry side but otherwise no evidence of urinary tract infection. Has had some upper back and shoulder muscle spasms, but has not had this kind of the low back and mid back. Problem.  Objective: Pleasant alert lady in no acute distress which he says, but exclaims out with the pain whenever he moves around and was flexion is somewhat limited, as is tension and side to side tilt by the pain. He's very tender left back from the lower ribs down to the lateral lobe back just above the left lateral iliac crest area she is tight. Straight leg raise test negative. Abdomen benign.  Assessment: Low and midback pain, primarily left.  Plan: X-ray of the lower ribs to make sure there is a then decide treatment. She's been taking ibuprofen but not been on any other major meds.   [facility] reading (PRIMARY) by  Dr. Alwyn Ren Normal ribs,  Lungs look clear.  Muscle relaxants, nsaids, pain meds.  If not improving over this week consider PT.

## 2011-10-12 NOTE — Patient Instructions (Signed)
Occasions as discussed. Continue to try to use some ice and heat alternating on the back. If you're not doing better return to a reevaluation and consider physical therapy.

## 2011-12-08 ENCOUNTER — Ambulatory Visit (INDEPENDENT_AMBULATORY_CARE_PROVIDER_SITE_OTHER): Payer: 59 | Admitting: Family Medicine

## 2011-12-08 VITALS — BP 126/76 | HR 100 | Temp 98.2°F | Resp 17 | Ht 69.0 in | Wt 163.0 lb

## 2011-12-08 DIAGNOSIS — R42 Dizziness and giddiness: Secondary | ICD-10-CM

## 2011-12-08 DIAGNOSIS — F419 Anxiety disorder, unspecified: Secondary | ICD-10-CM

## 2011-12-08 DIAGNOSIS — R110 Nausea: Secondary | ICD-10-CM

## 2011-12-08 DIAGNOSIS — F411 Generalized anxiety disorder: Secondary | ICD-10-CM

## 2011-12-08 DIAGNOSIS — R51 Headache: Secondary | ICD-10-CM

## 2011-12-08 MED ORDER — KETOROLAC TROMETHAMINE 60 MG/2ML IM SOLN
60.0000 mg | Freq: Once | INTRAMUSCULAR | Status: AC
  Administered 2011-12-08: 60 mg via INTRAMUSCULAR

## 2011-12-08 MED ORDER — BUTALBITAL-APAP-CAFFEINE 50-325-40 MG PO TABS: 1.0000 | ORAL_TABLET | Freq: Two times a day (BID) | ORAL | Status: DC | PRN

## 2011-12-08 MED ORDER — PROMETHAZINE HCL 25 MG PO TABS: 25.0000 mg | ORAL_TABLET | Freq: Three times a day (TID) | ORAL | Status: DC | PRN

## 2011-12-08 MED ORDER — PROPRANOLOL HCL 10 MG PO TABS: 10.0000 mg | ORAL_TABLET | Freq: Two times a day (BID) | ORAL | Status: DC

## 2011-12-08 MED ORDER — MECLIZINE HCL 50 MG PO TABS: 50.0000 mg | ORAL_TABLET | Freq: Three times a day (TID) | ORAL | Status: DC | PRN

## 2011-12-08 NOTE — Progress Notes (Signed)
[facility]:  Office Visit  Chief Complaint:  Chief Complaint  Patient presents with  . Dizziness  . Headache    HPI: Jasmine Jennings is a 36 y.o. female who complains of diffuse  HA and vertigo, described as a spinnning sensation, associated with some numbness and tingling sensation in arms. Stress related. She has been feeling this way since she started working at the [employer] [facility] [employer] ED. The ss are not new, just worse and more frequent. She is also a  Single mom which adds more stress. .Was on antidepressants (celexa)  and xanax for anxiety and depression. She does not want to be on antidepressants. The Xanax helped a little. She has been with [facility] ED for less than a year and is waiting to be with them for 1 year to see if she can transfer, she ultimately would like to go back to school to be a patient advocate . She does not like the clinical bedside aspect of her job as much. Denies thoughts of SI/HI. 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  Takes ambien to help with sleep prn.   Past Medical History  Diagnosis Date  . MRSA cellulitis of left foot     L foot  . Allergy   . Migraines    Past Surgical History  Procedure Date  . Leep    History   Social History  . Marital Status: Single    Spouse Name: N/A    Number of Children: N/A  . Years of Education: N/A   Social History Main Topics  . Smoking status: Never Smoker   . Smokeless tobacco: None  . Alcohol Use: No  . Drug Use: No  . Sexually Active: No   Other Topics Concern  . None   Social History Narrative  . None   Family History  Problem Relation Age of Onset  . Hyperlipidemia Mother   . Hypertension Mother   . Diabetes Other    Allergies  Allergen Reactions  . Doxycycline Other (See Comments)    Skin discoloration   . Latex   . Shellfish Allergy   . Yeast-Related Products   . Ciprofloxacin Rash   Prior to Admission medications   Medication Sig Start Date End Date Taking? Authorizing  Provider  ALPRAZolam Prudy Feeler) 0.5 MG tablet Take 0.5 mg by mouth at bedtime as needed. For sleep and anxiety   Yes Historical Provider, MD  zolpidem (AMBIEN) 10 MG tablet Take 5 mg by mouth at bedtime as needed. For sleep   Yes Historical Provider, MD  citalopram (CELEXA) 10 MG tablet Take 10 mg by mouth daily.    Historical Provider, MD  diclofenac (VOLTAREN) 75 MG EC tablet Take 1 tablet (75 mg total) by mouth 2 (two) times daily. 10/12/11   Peyton Najjar, MD  metaxalone [facility]) 800 MG tablet 1/2 to 1 three times daily for muscle relaxant 10/12/11   Peyton Najjar, MD  sulfamethoxazole-trimethoprim (BACTRIM,SEPTRA) 400-80 MG per tablet Take 2 tablets by mouth 2 (two) times daily. For MRSA infection    Historical Provider, MD     ROS: The patient denies fevers, chills, night sweats, unintentional weight loss, chest pain, palpitations, wheezing, dyspnea on exertion, nausea, vomiting, abdominal pain, dysuria, hematuria, melena  All other systems have been reviewed and were otherwise negative with the exception of those mentioned in the HPI and as above.    PHYSICAL EXAM: Filed Vitals:   12/08/11 1246  BP: 126/76  Pulse: 100  Temp: 98.2 F (36.8 C)  Resp: 17   Filed Vitals:   12/08/11 1246  Height: 5\' 9"  (1.753 m)  Weight: 163 lb (73.936 kg)   Body mass index is 24.07 kg/(m^2).  General: Alert, no acute distress HEENT:  Normocephalic, atraumatic, oropharynx patent. EOMI, PERRLA, fundoscopic exam nl Cardiovascular:  Regular rate and rhythm, no rubs murmurs or gallops.  No Carotid bruits, radial pulse intact. No pedal edema.  Respiratory: Clear to auscultation bilaterally.  No wheezes, rales, or rhonchi.  No cyanosis, no use of accessory musculature GI: No organomegaly, abdomen is soft and non-tender, positive bowel sounds.  No masses. Skin: No rashes. Neurologic: Facial musculature symmetric. CN 2-12 grossly nl Psychiatric: Patient is appropriate throughout our  interaction. Lymphatic: No cervical lymphadenopathy Musculoskeletal: Gait intact. Heel to toe test nl   LABS:    EKG/XRAY:   Primary read interpreted by Dr. Conley Rolls at [facility].   ASSESSMENT/PLAN: Encounter Diagnoses  Name Primary?  . Headache Yes  . Vertigo   . Anxiety   . Nausea    Will do a trial of propranolo 10 mg BID if blood pressure able to tolerate to help with migraine HA and anxiety. If BP too low then change to once daily and monitor BP and pulse. She does not want to be on any antidepressants or benzos at this time Will rx antivert for vertigo Will rx promethazine for nausea  Will also give fioricet for HA/migraines Patient was given toradol 60 mg x 1 in office F/u in 2-4 weeks or sooner prn    LE, THAO PHUONG, DO 12/09/2011 9:55 PM

## 2011-12-09 DIAGNOSIS — R51 Headache: Secondary | ICD-10-CM | POA: Insufficient documentation

## 2011-12-09 DIAGNOSIS — R42 Dizziness and giddiness: Secondary | ICD-10-CM | POA: Insufficient documentation

## 2011-12-09 DIAGNOSIS — R519 Headache, unspecified: Secondary | ICD-10-CM | POA: Insufficient documentation

## 2012-01-14 ENCOUNTER — Ambulatory Visit (INDEPENDENT_AMBULATORY_CARE_PROVIDER_SITE_OTHER): Payer: 59 | Admitting: Family Medicine

## 2012-01-14 VITALS — BP 118/68 | HR 82 | Temp 99.2°F | Resp 16 | Ht 68.0 in | Wt 171.6 lb

## 2012-01-14 DIAGNOSIS — R42 Dizziness and giddiness: Secondary | ICD-10-CM

## 2012-01-14 DIAGNOSIS — R519 Headache, unspecified: Secondary | ICD-10-CM

## 2012-01-14 DIAGNOSIS — G43909 Migraine, unspecified, not intractable, without status migrainosus: Secondary | ICD-10-CM

## 2012-01-14 DIAGNOSIS — R51 Headache: Secondary | ICD-10-CM

## 2012-01-14 NOTE — Progress Notes (Signed)
Subjective: Patient has been having problems with her headaches still. She is taking medication at Dr. Conley Rolls started her on it does not seem to have helped much. The pains usually in the right side of her hand. This morning she had a headache when she got up, was a little dizzy, partially from the stairs without injury. It is just concerning her that the headaches continue on without relief, and RA worse pattern than they used to be. She has had migraines since she was in elementary school, that has been different.  Objective: Pleasant lady in no acute distress. TMs normal. Eyes PERRLA. Fundi benign. Throat clear. Neck supple without nodes or thyromegaly. Cranial nerves 2-12 grossly intact. Gross motor normal finger nose normal tandem walk normal Romberg probably normal though a little unsteady. Patient is alert and oriented. Deep tendon reflexes trace, symmetrical.  Assessment: Headaches, no better or worse History of migraines Dizziness  Plan: Feel like it's time she has imaging. Went ahead and ordered MRI. Make referral to neurology. Increase her propanolol to 20 mg twice daily

## 2012-01-14 NOTE — Patient Instructions (Addendum)
Increase the propranolol to 20 mg twice daily.  MRI of head will be scheduled  We'll begin process of referral to a neurologist.

## 2012-01-18 ENCOUNTER — Ambulatory Visit
Admission: RE | Admit: 2012-01-18 | Discharge: 2012-01-18 | Disposition: A | Discharge status: Home / Self Care | Payer: 59 | Source: Ambulatory Visit | Attending: Family Medicine | Admitting: Family Medicine

## 2012-01-20 ENCOUNTER — Telehealth: Payer: Self-pay

## 2012-01-20 NOTE — Telephone Encounter (Signed)
Patient had mri done on Monday and is waiting for someone to call with the outcome.   Please call [phone]

## 2012-01-20 NOTE — Telephone Encounter (Signed)
Advised pt of results.

## 2012-04-14 ENCOUNTER — Other Ambulatory Visit: Payer: Self-pay | Admitting: Obstetrics and Gynecology

## 2012-08-16 ENCOUNTER — Encounter: Payer: Self-pay | Admitting: Family Medicine

## 2013-03-30 DIAGNOSIS — E785 Hyperlipidemia, unspecified: Secondary | ICD-10-CM | POA: Insufficient documentation

## 2013-03-30 DIAGNOSIS — J309 Allergic rhinitis, unspecified: Secondary | ICD-10-CM | POA: Insufficient documentation

## 2013-03-30 DIAGNOSIS — E663 Overweight: Secondary | ICD-10-CM | POA: Insufficient documentation

## 2013-05-02 ENCOUNTER — Other Ambulatory Visit: Payer: Self-pay | Admitting: Obstetrics and Gynecology

## 2013-10-08 ENCOUNTER — Ambulatory Visit (INDEPENDENT_AMBULATORY_CARE_PROVIDER_SITE_OTHER): Payer: 59 | Admitting: Emergency Medicine

## 2013-10-08 VITALS — BP 126/70 | HR 108 | Temp 97.9°F | Resp 18 | Ht 69.0 in | Wt 191.0 lb

## 2013-10-08 DIAGNOSIS — G4700 Insomnia, unspecified: Secondary | ICD-10-CM

## 2013-10-08 DIAGNOSIS — G43109 Migraine with aura, not intractable, without status migrainosus: Secondary | ICD-10-CM

## 2013-10-08 MED ORDER — ZOLPIDEM TARTRATE 10 MG PO TABS: 5.0000 mg | ORAL_TABLET | Freq: Every evening | ORAL | Status: DC | PRN

## 2013-10-08 MED ORDER — ELETRIPTAN HYDROBROMIDE 40 MG PO TABS: 40.0000 mg | ORAL_TABLET | ORAL | Status: DC | PRN

## 2013-10-08 NOTE — Progress Notes (Signed)
[facility] [address], [place] [id number] [phone]  Date:  10/08/2013   Name:  Jasmine Jennings Orsino   DOB:  10-Sep-1975   MRN:  [id number]  PCP:  No primary provider on file.    Chief Complaint: Migraine, Insomnia, Nausea, Emesis, Neck Pain and Medication Refill   History of Present Illness:  Jasmine Jennings Pinkhasov is a 38 y.o. very pleasant female patient who presents with the following:  History of migraines.  Never evaluated by neurologist.  Has an aura involving the right eye and then right hemispheric  On no meds.   Having increased stress with two jobs and preparing for MSN program. Recurrent insomnia that responds to [place] with no adverse effects. No improvement with over the counter medications or other home remedies.  Denies other complaint or health concern today.   Patient Active Problem List   Diagnosis Date Noted  . Vertigo 12/09/2011  . Headache 12/09/2011    Past Medical History  Diagnosis Date  . MRSA cellulitis of left foot     L foot  . Allergy   . Migraines     Past Surgical History  Procedure Laterality Date  . Leep    . Cryo      History  Substance Use Topics  . Smoking status: Never Smoker   . Smokeless tobacco: Not on file  . Alcohol Use: No    Family History  Problem Relation Age of Onset  . Hyperlipidemia Mother   . Hypertension Mother   . Diabetes Other     Allergies  Allergen Reactions  . Doxycycline Other (See Comments)    Skin discoloration   . Latex   . Shellfish Allergy   . Yeast-Related Products   . Ciprofloxacin Rash    Medication list has been reviewed and updated.  Current Outpatient Prescriptions on File Prior to Visit  Medication Sig Dispense Refill  . butalbital-acetaminophen-caffeine (FIORICET, ESGIC) 50-325-40 MG per tablet Take 1 tablet by mouth 2 (two) times daily as needed for headache.  14 tablet  5  . zolpidem (AMBIEN) 10 MG tablet Take 5 mg by mouth at bedtime as needed. For  sleep       No current facility-administered medications on file prior to visit.    Review of Systems:  As per HPI, otherwise negative.    Physical Examination: Filed Vitals:   10/08/13 0819  BP: 126/70  Pulse: 108  Temp: 97.9 F (36.6 C)  Resp: 18   Filed Vitals:   10/08/13 0819  Height: 5\' 9"  (1.753 Jennings)  Weight: 191 lb (86.637 kg)   Body mass index is 28.19 kg/(Jennings^2). Ideal Body Weight: Weight in (lb) to have BMI = 25: 168.9  GEN: WDWN, NAD, Non-toxic, A & O x 3 HEENT: Atraumatic, Normocephalic. Neck supple. No masses, No LAD. Ears and Nose: No external deformity. CV: RRR, No Jennings/G/R. No JVD. No thrill. No extra heart sounds. PULM: CTA B, no wheezes, crackles, rhonchi. No retractions. No resp. distress. No accessory muscle use. ABD: S, NT, ND, +BS. No rebound. No HSM. EXTR: No c/c/e NEURO Normal gait.  PSYCH: Normally interactive. Conversant. Not depressed or anxious appearing.  Calm demeanor.    Assessment and Plan: Migraine with aura Insomnia relpax Amadeo Garnetambien  Signed,  Phillips OdorJeffery Ayame Rena, MD

## 2013-10-08 NOTE — Patient Instructions (Signed)

## 2013-10-09 ENCOUNTER — Telehealth: Payer: Self-pay

## 2013-10-09 NOTE — Telephone Encounter (Signed)
Pt states the medication called in for her is to expensive for her since insurance won't pay for it. Would like to know if she could get the generic Imitrex or Maxalt because it is cheaper Please call [phone]   [facility] ON [address]

## 2013-10-10 ENCOUNTER — Other Ambulatory Visit: Payer: Self-pay | Admitting: Emergency Medicine

## 2013-10-10 MED ORDER — SUMATRIPTAN SUCCINATE 100 MG PO TABS: 100.0000 mg | ORAL_TABLET | Freq: Once | ORAL | Status: DC

## 2013-10-10 NOTE — Telephone Encounter (Signed)
LM to advise pt. 

## 2013-10-10 NOTE — Telephone Encounter (Signed)
Pt called back to check on status. Can someone else please address this in Dr Ewell PoeAnderson's absence from office?

## 2013-10-10 NOTE — Telephone Encounter (Signed)
Let her know i sent imitrex

## 2014-01-26 ENCOUNTER — Telehealth: Payer: Self-pay | Admitting: Nurse Practitioner

## 2014-01-26 DIAGNOSIS — M545 Low back pain, unspecified: Secondary | ICD-10-CM

## 2014-01-26 MED ORDER — NAPROXEN 500 MG PO TABS: 500.0000 mg | ORAL_TABLET | Freq: Two times a day (BID) | ORAL | Status: DC

## 2014-01-26 MED ORDER — CYCLOBENZAPRINE HCL 10 MG PO TABS: 10.0000 mg | ORAL_TABLET | Freq: Three times a day (TID) | ORAL | Status: DC | PRN

## 2014-01-26 NOTE — Progress Notes (Signed)
We are sorry that you are not feeling well.  Here is how we plan to help!  Based on what you have shared with me it looks like you mostly have acute back pain.  Acute back pain is defined as musculoskeletal pain that can resolve in 1-3 weeks with conservative treatment.  I have prescribed Naprosyn 500 mg twice a day non-steroid anti-inflammatory (NSAID) as well as Flexeril 10 mg every eight hours as needed which is a muscle relaxer.  Some patients experience stomach irritation or in increased heartburn with anti-inflammatory drugs.  Please keep in mind that muscle relaxer's can cause fatigue and should not be taken while at work or driving.  Back pain is very common.  The pain often gets better over time.  The cause of back pain is usually not dangerous.  Most people can learn to manage their back pain on their own.  Home Care  Stay active.  Start with short walks on flat ground if you can.  Try to walk farther each day.  Do not sit, drive or stand in one place for more than 30 minutes.  Do not stay in bed.  Do not avoid exercise or work.  Activity can help your back heal faster.  Be careful when you bend or lift an object.  Bend at your knees, keep the object close to you, and do not twist.  Sleep on a firm mattress.  Lie on your side, and bend your knees.  If you lie on your back, put a pillow under your knees.  Only take medicines as told by your doctor.  Put ice on the injured area.  Put ice in a plastic bag  Place a towel between your skin and the bag  Leave the ice on for 15-20 minutes, 3-4 times a day for the first 2-3 days.  After that, you can switch between ice and heat packs.  Ask your doctor about back exercises or massage.  Avoid feeling anxious or stressed.  Find good ways to deal with stress, such as exercise.  Get Help Right Way If:  Your pain does not go away with rest or medicine.  Your pain does not go away in 1 week.  You have new problems.  You do not  feel well.  The pain spreads into your legs.  You cannot control when you poop (bowel movement) or pee (urinate)  You feel sick to your stomach (nauseous) or throw up (vomit)  You have belly (abdominal) pain.  You feel like you may pass out (faint).  If you develop a fever.  Make Sure you:  Understand these instructions.  Will watch your condition  Will get help right away if you are not doing well or get worse.  Your e-visit answers were reviewed by a board certified advanced clinical practitioner to complete your personal care plan.  Depending on the condition, your plan could have included both over the counter or prescription medications.  If there is a problem please reply  once you have received a response from your provider.  Your safety is important to us.  If you have drug allergies check your prescription carefully.    You can use MyChart to ask questions about today's visit, request a non-urgent call back, or ask for a work or school excuse.  You will get an e-mail in the next two days asking about your experience.  I hope that your e-visit has been valuable and will speed your recovery. Thank you   for using e-visits.   

## 2014-02-11 ENCOUNTER — Encounter: Payer: Self-pay | Admitting: Emergency Medicine

## 2014-02-14 ENCOUNTER — Ambulatory Visit (INDEPENDENT_AMBULATORY_CARE_PROVIDER_SITE_OTHER): Payer: 59 | Admitting: Family Medicine

## 2014-02-14 VITALS — BP 132/84 | HR 101 | Temp 98.9°F | Resp 20 | Ht 69.0 in | Wt 201.0 lb

## 2014-02-14 DIAGNOSIS — Z566 Other physical and mental strain related to work: Secondary | ICD-10-CM

## 2014-02-14 DIAGNOSIS — R002 Palpitations: Secondary | ICD-10-CM

## 2014-02-14 DIAGNOSIS — R635 Abnormal weight gain: Secondary | ICD-10-CM

## 2014-02-14 DIAGNOSIS — R739 Hyperglycemia, unspecified: Secondary | ICD-10-CM

## 2014-02-14 DIAGNOSIS — Z13220 Encounter for screening for lipoid disorders: Secondary | ICD-10-CM

## 2014-02-14 DIAGNOSIS — F411 Generalized anxiety disorder: Secondary | ICD-10-CM

## 2014-02-14 DIAGNOSIS — G4700 Insomnia, unspecified: Secondary | ICD-10-CM

## 2014-02-14 DIAGNOSIS — Z130 Encounter for screening for diseases of the blood and blood-forming organs and certain disorders involving the immune mechanism: Secondary | ICD-10-CM

## 2014-02-14 DIAGNOSIS — Z1329 Encounter for screening for other suspected endocrine disorder: Secondary | ICD-10-CM

## 2014-02-14 LAB — POCT CBC
Granulocyte percent: 60.3 % (ref 37–80)
HCT, POC: 40.1 % (ref 37.7–47.9)
Hemoglobin: 12 g/dL — AB (ref 12.2–16.2)
Lymph, poc: 4.2 — AB (ref 0.6–3.4)
MCH, POC: 23.5 pg — AB (ref 27–31.2)
MCHC: 29.8 g/dL — AB (ref 31.8–35.4)
MCV: 78.6 fL — AB (ref 80–97)
MID (cbc): 0.3 (ref 0–0.9)
MPV: 7.6 fL (ref 0–99.8)
POC Granulocyte: 6.8 (ref 2–6.9)
POC LYMPH PERCENT: 37 % (ref 10–50)
POC MID %: 2.7 %M (ref 0–12)
Platelet Count, POC: 314 10*3/uL (ref 142–424)
RBC: 5.1 M/uL (ref 4.04–5.48)
RDW, POC: 15.5 %
WBC: 11.3 10*3/uL — AB (ref 4.6–10.2)

## 2014-02-14 LAB — POCT GLYCOSYLATED HEMOGLOBIN (HGB A1C): Hemoglobin A1C: 5.3

## 2014-02-14 MED ORDER — ALPRAZOLAM 0.25 MG PO TABS: 0.2500 mg | ORAL_TABLET | Freq: Two times a day (BID) | ORAL | Status: DC | PRN

## 2014-02-14 NOTE — Progress Notes (Signed)
Chief Complaint:  Chief Complaint  Patient presents with  . Insomnia    Ambien not effective  . Anxiety    HPI: Jasmine Jennings is a 39 y.o. female who is here for  Insomnia and nerves.  She denies any other sxs, she works at [employer], she does nursing, lost of stressors, mom has sarcoidosis and dad just  [place] with early Arch[place], Sister graduated occupational health and went to [place], that was the [place] for [place] but she has not made the transition yet. She travels a lot and ahs a large terriotory to cover.  She has tried [place] without relief.   Wt Readings from Last 3 Encounters:  02/14/14 201 lb (91.173 kg)  10/08/13 191 lb (86.637 kg)  01/14/12 171 lb 9.6 oz (77.837 kg)    Past Medical History  Diagnosis Date  . MRSA cellulitis of left foot     L foot  . Allergy   . Migraines    Past Surgical History  Procedure Laterality Date  . Leep    . Cryo     History   Social History  . Marital Status: Single    Spouse Name: N/A  . Number of Children: N/A  . Years of Education: N/A   Social History Main Topics  . Smoking status: Never Smoker   . Smokeless tobacco: Not on file  . Alcohol Use: No  . Drug Use: No  . Sexual Activity: No   Other Topics Concern  . None   Social History Narrative   Family History  Problem Relation Age of Onset  . Hyperlipidemia Mother   . Hypertension Mother   . Diabetes Other    Allergies  Allergen Reactions  . Doxycycline Other (See Comments)    Skin discoloration   . Latex   . Shellfish Allergy   . Yeast-Related Products   . Ciprofloxacin Rash   Prior to Admission medications   Medication Sig Start Date End Date Taking? Authorizing Provider  SUMAtriptan (IMITREX) 100 MG tablet Take 1 tablet (100 mg total) by mouth once. May repeat in 2 hours if headache persists or recurs. 10/10/13  Yes Carmelina DaneJeffery S Anderson, MD  zolpidem (AMBIEN) 10 MG tablet Take 0.5  tablets (5 mg total) by mouth at bedtime as needed. For sleep 10/08/13  Yes Carmelina DaneJeffery S Anderson, MD  naproxen (NAPROSYN) 500 MG tablet Take 1 tablet (500 mg total) by mouth 2 (two) times daily with a meal. Patient not taking: Reported on 02/14/2014 01/26/14   Mary-Margaret Daphine DeutscherMartin, FNP     ROS: The patient denies fevers, chills, night sweats, unintentional weight loss, chest pain, palpitations, wheezing, dyspnea on exertion, nausea, vomiting, abdominal pain, dysuria, hematuria, melena, numbness, weakness, or tingling.  All other systems have been reviewed and were otherwise negative with the exception of those mentioned in the HPI and as above.    PHYSICAL EXAM: Filed Vitals:   02/14/14 1554  BP: 132/84  Pulse: 101  Temp: 98.9 F (37.2 C)  Resp: 20   Filed Vitals:   02/14/14 1554  Height: 5\' 9"  (1.753 m)  Weight: 201 lb (91.173 kg)   Body mass index is 29.67 kg/(m^2).  General: Alert, no acute distress HEENT:  Normocephalic, atraumatic, oropharynx patent. EOMI, PERRLA Cardiovascular:  Regular rate and rhythm, no rubs murmurs or gallops.  No Carotid bruits, radial pulse intact. No pedal edema.  Respiratory: Clear to auscultation  bilaterally.  No wheezes, rales, or rhonchi.  No cyanosis, no use of accessory musculature GI: No organomegaly, abdomen is soft and non-tender, positive bowel sounds.  No masses. Skin: No rashes. Neurologic: Facial musculature symmetric. Psychiatric: Patient is appropriate throughout our interaction. Lymphatic: No cervical lymphadenopathy Musculoskeletal: Gait intact.   LABS:    EKG/XRAY:   Primary read interpreted by Dr[facility] Conley Rolls at [facility].   ASSESSMENT/PLAN: Encounter Diagnoses  Name Primary?  . Insomnia Yes  . Anxiety state   . Stress at work   . Weight gain   . Screening for deficiency anemia   . Screening for hyperlipidemia   . Screening for thyroid disorder   . Hyperglycemia   . Palpitations    EKG WNL She is going to try xanax and see if  helps with stress at home/work and also insomnia She tries to have good sleep hygiene but not sure if successful, will cont to try harder Labs pending F/u prn   Gross sideeffects, risk and benefits, and alternatives of medications d/w patient. Patient is aware that all medications have potential sideeffects and we are unable to predict every sideeffect or drug-drug interaction that may occur.  LE, THAO PHUONG, DO 02/14/2014 6:00 PM

## 2014-02-15 LAB — COMPREHENSIVE METABOLIC PANEL WITH GFR
AST: 13 U/L (ref 0–37)
BUN: 7 mg/dL (ref 6–23)
Chloride: 102 meq/L (ref 96–112)
Creat: 0.81 mg/dL (ref 0.50–1.10)
Glucose, Bld: 99 mg/dL (ref 70–99)
Potassium: 3.9 meq/L (ref 3.5–5.3)
Total Bilirubin: 0.3 mg/dL (ref 0.2–1.2)

## 2014-02-15 LAB — COMPREHENSIVE METABOLIC PANEL
ALT: 11 U/L (ref 0–35)
Albumin: 4.1 g/dL (ref 3.5–5.2)
Alkaline Phosphatase: 47 U/L (ref 39–117)
CO2: 24 mEq/L (ref 19–32)
Calcium: 9.4 mg/dL (ref 8.4–10.5)
Sodium: 136 mEq/L (ref 135–145)
Total Protein: 7.5 g/dL (ref 6.0–8.3)

## 2014-02-15 LAB — LIPID PANEL
Cholesterol: 220 mg/dL — ABNORMAL HIGH (ref 0–200)
HDL: 40 mg/dL (ref >39)
LDL Cholesterol: 152 mg/dL — ABNORMAL HIGH (ref 0–99)
Total CHOL/HDL Ratio: 5.5 Ratio
Triglycerides: 139 mg/dL (ref <150)
VLDL: 28 mg/dL (ref 0–40)

## 2014-02-15 LAB — TSH: TSH: 1.42 u[IU]/mL (ref 0.350–4.500)

## 2014-03-01 ENCOUNTER — Encounter: Payer: Self-pay | Admitting: Family Medicine

## 2014-03-22 ENCOUNTER — Ambulatory Visit (INDEPENDENT_AMBULATORY_CARE_PROVIDER_SITE_OTHER): Payer: 59 | Admitting: Family Medicine

## 2014-03-22 ENCOUNTER — Telehealth: Payer: Self-pay

## 2014-03-22 VITALS — BP 130/80 | HR 104 | Temp 99.0°F | Resp 17 | Ht 69.0 in | Wt 198.0 lb

## 2014-03-22 DIAGNOSIS — Z113 Encounter for screening for infections with a predominantly sexual mode of transmission: Secondary | ICD-10-CM | POA: Diagnosis not present

## 2014-03-22 DIAGNOSIS — G4700 Insomnia, unspecified: Secondary | ICD-10-CM

## 2014-03-22 DIAGNOSIS — N898 Other specified noninflammatory disorders of vagina: Secondary | ICD-10-CM

## 2014-03-22 DIAGNOSIS — N9089 Other specified noninflammatory disorders of vulva and perineum: Secondary | ICD-10-CM

## 2014-03-22 DIAGNOSIS — F411 Generalized anxiety disorder: Secondary | ICD-10-CM

## 2014-03-22 DIAGNOSIS — R21 Rash and other nonspecific skin eruption: Secondary | ICD-10-CM

## 2014-03-22 LAB — POCT WET PREP WITH KOH
KOH PREP POC: NEGATIVE
Trichomonas, UA: NEGATIVE
Yeast Wet Prep HPF POC: NEGATIVE

## 2014-03-22 LAB — RPR

## 2014-03-22 LAB — HIV ANTIBODY (ROUTINE TESTING W REFLEX): HIV 1&2 Ab, 4th Generation: NONREACTIVE

## 2014-03-22 MED ORDER — CLONAZEPAM 1 MG PO TABS: ORAL_TABLET | ORAL | Status: DC

## 2014-03-22 MED ORDER — HYDROCORTISONE 2.5 % EX LOTN: TOPICAL_LOTION | CUTANEOUS | Status: DC

## 2014-03-22 MED ORDER — CLOBETASOL PROPIONATE 0.05 % EX LOTN: 1.0000 "application " | TOPICAL_LOTION | Freq: Two times a day (BID) | CUTANEOUS | Status: DC

## 2014-03-22 NOTE — Telephone Encounter (Signed)
Clobetasol Propionate 0.05 % lotion  High Priority due to being seen today.   Is there a less expensive alternative.   [facility] [address]   [phone]

## 2014-03-22 NOTE — Telephone Encounter (Signed)
Spoke with pt, advised message from Dr. Daub. Pt understood. 

## 2014-03-22 NOTE — Telephone Encounter (Signed)
Please call her and let her know that i have sent in an alternative that should be less expensive.

## 2014-03-22 NOTE — Progress Notes (Signed)
Subjective:    Patient ID: Jasmine Jennings, female    DOB: December 03, 1975, 39 y.o.   MRN: [id number]  HPI This is a pleasant 39 yo female who presents today with 3 day history of itchy, red rash on her upper inner thighs. She reports a history of sensitive skin and it is not uncommon for her to have allergic dermatitis or contact dermatitis. She is currently using [employer] products. She has discomfort when her thighs rub together. She also has a small, pimple like bump on her labia. She just noticed it today. She has had perineal boils in the past. She is a monogamous relationship. She had a normal pap smear 4/15. She requests STI testing. She has never had STI.   She continues to have insomnia and anxiety without relief from Xanax 0.25x 2, or ambien. She has not been able to sleep through the night. Her father who has Alzheimer's is currently living with her and she is in the process of having him evaluated to go into long term care. He sundowns and she has not been sleeping more than 3 hours a night on top of her existing difficulties with sleep. 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 She will have a week off soon and someone will be taking her father temporarily so she is hoping to get some rest.   Review of Systems No fever/chills, no vaginal discharge or itching, no dysuria, no hematuria. No abdominal pain, no nausea, no vomiting.     Objective:   Physical Exam  Constitutional: She is oriented to person, place, and time. She appears well-developed and well-nourished.  HENT:  Head: Normocephalic and atraumatic.  Eyes: Conjunctivae are normal.  Neck: Normal range of motion. Neck supple.  Cardiovascular: Normal rate.   Pulmonary/Chest: Effort normal.  Genitourinary: Pelvic exam was performed with patient supine. There is lesion on the right labia. Vaginal discharge (thick white) found.  Right labia majora with 3 mm solid lesion. Mildly  tender. No drainage. No erythema.   Musculoskeletal: Normal range of motion.  Neurological: She is alert and oriented to person, place, and time.  Skin: Skin is warm and dry. Rash noted.  Upper inner thighs with non-raised, erythematous rash.   Psychiatric: She has a normal mood and affect. Her behavior is normal. Judgment and thought content normal.  Vitals reviewed.  BP 130/80 mmHg  Pulse 104  Temp(Src) 99 F (37.2 C) (Oral)  Resp 17  Ht 5\' 9"  (1.753 m)  Wt 198 lb (89.812 kg)  BMI 29.23 kg/m2  SpO2 97%  LMP 01/12/2014     Assessment & Plan:  1. Rash and nonspecific skin eruption - suspect contact dermatitis - suggested she use unscented, mild soap and make sure area dry. Can wear compression shorts or tights to prevent thighs from rubbing together. - hydrocortisone 2.5 % lotion; Apply sparingly to affected area twice a day for maximum 10 days.  Dispense: 118 mL; Refill: 0  2. Lesion of labia - warm compresses several times a day - RTC if any increased size, pain  3. Routine screening for STI (sexually transmitted infection) - GC/Chlamydia Probe Amp - HIV antibody - HSV(herpes simplex vrs) 1+2 ab-IgG - RPR  4. Insomnia - encouraged regular exercise and self care measures - clonazePAM (KLONOPIN) 1 MG tablet; Take 1/2 to 1 tablet by mouth twice a day as needed for anxiety or sleep  Dispense: 30  tablet; Refill: 0  5. Vaginal discharge - POCT Wet Prep with KOH  6. Generalized anxiety disorder - encouraged self care measures, regular exercise, may need therapy/counseling, daily medication - clonazePAM (KLONOPIN) 1 MG tablet; Take 1/2 to 1 tablet by mouth twice a day as needed for anxiety or sleep  Dispense: 30 tablet; Refill: 0  - follow up anxiety in 4-6 weeks  Emi Belfast, FNP-BC  [facility]  03/24/2014 9:16 PM

## 2014-03-22 NOTE — Patient Instructions (Addendum)
Use warm compresses for boil- return to clinic if it gets larger, more painful or you develop a fever.  Contact Dermatitis Contact dermatitis is a reaction to certain substances that touch the skin. Contact dermatitis can be either irritant contact dermatitis or allergic contact dermatitis. Irritant contact dermatitis does not require previous exposure to the substance for a reaction to occur.Allergic contact dermatitis only occurs if you have been exposed to the substance before. Upon a repeat exposure, your body reacts to the substance.  CAUSES  Many substances can cause contact dermatitis. Irritant dermatitis is most commonly caused by repeated exposure to mildly irritating substances, such as:  Makeup.  Soaps.  Detergents.  Bleaches.  Acids.  Metal salts, such as nickel. Allergic contact dermatitis is most commonly caused by exposure to:  Poisonous plants.  Chemicals (deodorants, shampoos).  Jewelry.  Latex.  Neomycin in triple antibiotic cream.  Preservatives in products, including clothing. SYMPTOMS  The area of skin that is exposed may develop:  Dryness or flaking.  Redness.  Cracks.  Itching.  Pain or a burning sensation.  Blisters. With allergic contact dermatitis, there may also be swelling in areas such as the eyelids, mouth, or genitals.  DIAGNOSIS  Your caregiver can usually tell what the problem is by doing a physical exam. In cases where the cause is uncertain and an allergic contact dermatitis is suspected, a patch skin test may be performed to help determine the cause of your dermatitis. TREATMENT Treatment includes protecting the skin from further contact with the irritating substance by avoiding that substance if possible. Barrier creams, powders, and gloves may be helpful. Your caregiver may also recommend:  Steroid creams or ointments applied 2 times daily. For best results, soak the rash area in cool water for 20 minutes. Then apply the  medicine. Cover the area with a plastic wrap. You can store the steroid cream in the refrigerator for a "chilly" effect on your rash. That may decrease itching. Oral steroid medicines may be needed in more severe cases.  Antibiotics or antibacterial ointments if a skin infection is present.  Antihistamine lotion or an antihistamine taken by mouth to ease itching.  Lubricants to keep moisture in your skin.  Burow's solution to reduce redness and soreness or to dry a weeping rash. Mix one packet or tablet of solution in 2 cups cool water. Dip a clean washcloth in the mixture, wring it out a bit, and put it on the affected area. Leave the cloth in place for 30 minutes. Do this as often as possible throughout the day.  Taking several cornstarch or baking soda baths daily if the area is too large to cover with a washcloth. Harsh chemicals, such as alkalis or acids, can cause skin damage that is like a burn. You should flush your skin for 15 to 20 minutes with cold water after such an exposure. You should also seek immediate medical care after exposure. Bandages (dressings), antibiotics, and pain medicine may be needed for severely irritated skin.  HOME CARE INSTRUCTIONS  Avoid the substance that caused your reaction.  Keep the area of skin that is affected away from hot water, soap, sunlight, chemicals, acidic substances, or anything else that would irritate your skin.  Do not scratch the rash. Scratching may cause the rash to become infected.  You may take cool baths to help stop the itching.  Only take over-the-counter or prescription medicines as directed by your caregiver.  See your caregiver for follow-up care as directed to  make sure your skin is healing properly. SEEK MEDICAL CARE IF:   Your condition is not better after 3 days of treatment.  You seem to be getting worse.  You see signs of infection such as swelling, tenderness, redness, soreness, or warmth in the affected  area.  You have any problems related to your medicines. Document Released: 12/20/1999 Document Revised: 03/16/2011 Document Reviewed: 05/27/2010 [facility] Patient Information 2015 [place], [place]. This information is not intended to replace advice given to you by your health care provider. Make sure you discuss any questions you have with your health care provider.

## 2014-03-23 LAB — HSV(HERPES SIMPLEX VRS) I + II AB-IGG
HSV 1 Glycoprotein G Ab, IgG: <0.1 IV
HSV 2 Glycoprotein G Ab, IgG: <0.1 IV

## 2014-03-23 LAB — GC/CHLAMYDIA PROBE AMP
CT Probe RNA: NEGATIVE
GC Probe RNA: NEGATIVE

## 2014-03-27 ENCOUNTER — Encounter: Payer: Self-pay | Admitting: Family Medicine

## 2014-03-28 ENCOUNTER — Other Ambulatory Visit: Payer: Self-pay | Admitting: Family Medicine

## 2014-03-28 DIAGNOSIS — G4700 Insomnia, unspecified: Secondary | ICD-10-CM

## 2014-03-28 MED ORDER — ZOLPIDEM TARTRATE 10 MG PO TABS: 10.0000 mg | ORAL_TABLET | Freq: Every evening | ORAL | Status: AC | PRN

## 2014-04-19 ENCOUNTER — Other Ambulatory Visit: Payer: Self-pay | Admitting: Family Medicine

## 2014-04-19 DIAGNOSIS — F411 Generalized anxiety disorder: Secondary | ICD-10-CM

## 2014-04-19 DIAGNOSIS — G4700 Insomnia, unspecified: Secondary | ICD-10-CM

## 2014-04-19 MED ORDER — CLONAZEPAM 1 MG PO TABS: ORAL_TABLET | ORAL | Status: DC

## 2014-04-19 NOTE — Progress Notes (Signed)
Called in prescription for Klonopin to patient's pharmacy. Sent her a Clinical cytogeneticistmychart message that further refills will require follow with me or Dr. Conley RollsLe.

## 2014-05-04 ENCOUNTER — Other Ambulatory Visit: Payer: Self-pay | Admitting: Obstetrics and Gynecology

## 2014-05-07 LAB — CYTOLOGY - PAP

## 2014-06-08 ENCOUNTER — Emergency Department (HOSPITAL_COMMUNITY)
Admission: EM | Admit: 2014-06-08 | Discharge: 2014-06-08 | Disposition: A | Discharge status: Home / Self Care | Payer: 59 | Attending: Emergency Medicine | Admitting: Emergency Medicine

## 2014-06-08 ENCOUNTER — Other Ambulatory Visit: Payer: Self-pay

## 2014-06-08 ENCOUNTER — Encounter (HOSPITAL_COMMUNITY): Payer: Self-pay | Admitting: Emergency Medicine

## 2014-06-08 DIAGNOSIS — F410 Panic disorder [episodic paroxysmal anxiety] without agoraphobia: Secondary | ICD-10-CM | POA: Diagnosis not present

## 2014-06-08 DIAGNOSIS — H9209 Otalgia, unspecified ear: Secondary | ICD-10-CM | POA: Insufficient documentation

## 2014-06-08 DIAGNOSIS — Z79899 Other long term (current) drug therapy: Secondary | ICD-10-CM | POA: Insufficient documentation

## 2014-06-08 DIAGNOSIS — R42 Dizziness and giddiness: Secondary | ICD-10-CM

## 2014-06-08 DIAGNOSIS — Z8614 Personal history of Methicillin resistant Staphylococcus aureus infection: Secondary | ICD-10-CM | POA: Insufficient documentation

## 2014-06-08 DIAGNOSIS — Z91040 Latex allergy status: Secondary | ICD-10-CM | POA: Diagnosis not present

## 2014-06-08 DIAGNOSIS — F419 Anxiety disorder, unspecified: Secondary | ICD-10-CM | POA: Diagnosis not present

## 2014-06-08 DIAGNOSIS — G43909 Migraine, unspecified, not intractable, without status migrainosus: Secondary | ICD-10-CM | POA: Insufficient documentation

## 2014-06-08 DIAGNOSIS — J069 Acute upper respiratory infection, unspecified: Secondary | ICD-10-CM | POA: Insufficient documentation

## 2014-06-08 HISTORY — DX: Dizziness and giddiness: R42

## 2014-06-08 MED ORDER — DIAZEPAM 5 MG PO TABS: 5.0000 mg | ORAL_TABLET | Freq: Three times a day (TID) | ORAL | Status: DC | PRN

## 2014-06-08 MED ORDER — SALINE SPRAY 0.65 % NA SOLN: 1.0000 | NASAL | Status: DC | PRN

## 2014-06-08 MED ORDER — ONDANSETRON 4 MG PO TBDP
4.0000 mg | ORAL_TABLET | Freq: Once | ORAL | Status: AC
  Administered 2014-06-08: 4 mg via ORAL
  Filled 2014-06-08: qty 1

## 2014-06-08 MED ORDER — MECLIZINE HCL 25 MG PO TABS
25.0000 mg | ORAL_TABLET | Freq: Once | ORAL | Status: AC
  Administered 2014-06-08: 25 mg via ORAL
  Filled 2014-06-08: qty 1

## 2014-06-08 MED ORDER — SALINE SPRAY 0.65 % NA SOLN
1.0000 | Freq: Once | NASAL | Status: AC
  Administered 2014-06-08: 1 via NASAL
  Filled 2014-06-08: qty 44

## 2014-06-08 MED ORDER — MECLIZINE HCL 25 MG PO TABS: 25.0000 mg | ORAL_TABLET | Freq: Three times a day (TID) | ORAL | Status: DC | PRN

## 2014-06-08 MED ORDER — FLUTICASONE PROPIONATE 50 MCG/ACT NA SUSP: 2.0000 | Freq: Every day | NASAL | Status: DC

## 2014-06-08 MED ORDER — DIAZEPAM 5 MG PO TABS
5.0000 mg | ORAL_TABLET | Freq: Once | ORAL | Status: AC
  Administered 2014-06-08: 5 mg via ORAL
  Filled 2014-06-08: qty 1

## 2014-06-08 MED ORDER — SODIUM CHLORIDE 0.9 % IV BOLUS (SEPSIS): 1000.0000 mL | Freq: Once | INTRAVENOUS | Status: DC

## 2014-06-08 MED ORDER — FLUTICASONE PROPIONATE 50 MCG/ACT NA SUSP
2.0000 | Freq: Every day | NASAL | Status: DC
  Administered 2014-06-08: 2 via NASAL
  Filled 2014-06-08: qty 16

## 2014-06-08 NOTE — ED Notes (Signed)
Pt states she has had congestion lately and tonight she woke up with tingling on the right side of her face, tingling in her left arm, pain in her left shoulder and feeling very dizzy  Pt states she is nauseated  Upon arrival to room pt states she has a hx of vertigo and her right ear feels clogged  Pt tearful in triage

## 2014-06-08 NOTE — Discharge Instructions (Signed)
Benign Positional Vertigo Vertigo means you feel like you or your surroundings are moving when they are not. Benign positional vertigo is the most common form of vertigo. Benign means that the cause of your condition is not serious. Benign positional vertigo is more common in older adults. CAUSES  Benign positional vertigo is the result of an upset in the labyrinth system. This is an area in the middle ear that helps control your balance. This may be caused by a viral infection, head injury, or repetitive motion. However, often no specific cause is found. SYMPTOMS  Symptoms of benign positional vertigo occur when you move your head or eyes in different directions. Some of the symptoms may include:  Loss of balance and falls.  Vomiting.  Blurred vision.  Dizziness.  Nausea.  Involuntary eye movements (nystagmus). DIAGNOSIS  Benign positional vertigo is usually diagnosed by physical exam. If the specific cause of your benign positional vertigo is unknown, your caregiver may perform imaging tests, such as magnetic resonance imaging (MRI) or computed tomography (CT). TREATMENT  Your caregiver may recommend movements or procedures to correct the benign positional vertigo. Medicines such as meclizine, benzodiazepines, and medicines for nausea may be used to treat your symptoms. In rare cases, if your symptoms are caused by certain conditions that affect the inner ear, you may need surgery. HOME CARE INSTRUCTIONS   Follow your caregiver's instructions.  Move slowly. Do not make sudden body or head movements.  Avoid driving.  Avoid operating heavy machinery.  Avoid performing any tasks that would be dangerous to you or others during a vertigo episode.  Drink enough fluids to keep your urine clear or pale yellow. SEEK IMMEDIATE MEDICAL CARE IF:   You develop problems with walking, weakness, numbness, or using your arms, hands, or legs.  You have difficulty speaking.  You develop  severe headaches.  Your nausea or vomiting continues or gets worse.  You develop visual changes.  Your family or friends notice any behavioral changes.  Your condition gets worse.  You have a fever.  You develop a stiff neck or sensitivity to light. MAKE SURE YOU:   Understand these instructions.  Will watch your condition.  Will get help right away if you are not doing well or get worse. Document Released: 09/29/2005 Document Revised: 03/16/2011 Document Reviewed: 09/11/2010 [facility] Patient Information 2015 [place]. This information is not intended to replace advice given to you by your health care provider. Make sure you discuss any questions you have with your health care provider. Panic Attacks Panic attacks are sudden, short-livedsurges of severe anxiety, fear, or discomfort. They may occur for no reason when you are relaxed, when you are anxious, or when you are sleeping. Panic attacks may occur for a number of reasons:   Healthy people occasionally have panic attacks in extreme, life-threatening situations, such as war or natural disasters. Normal anxiety is a protective mechanism of the body that helps us react to danger (fight or flight response).  Panic attacks are often seen with anxiety disorders, such as panic disorder, social anxiety disorder, generalized anxiety disorder, and phobias. Anxiety disorders cause excessive or uncontrollable anxiety. They may interfere with your relationships or other life activities.  Panic attacks are sometimes seen with other mental illnesses, such as depression and posttraumatic stress disorder.  Certain medical conditions, prescription medicines, and drugs of abuse can cause panic attacks. SYMPTOMS  Panic attacks start suddenly, peak within 20 minutes, and are accompanied by four or more of the following  symptoms:  Pounding heart or fast heart rate (palpitations).  Sweating.  Trembling or shaking.  Shortness of breath  or feeling smothered.  Feeling choked.  Chest pain or discomfort.  Nausea or strange feeling in your stomach.  Dizziness, light-headedness, or feeling like you will faint.  Chills or hot flushes.  Numbness or tingling in your lips or hands and feet.  Feeling that things are not real or feeling that you are not yourself.  Fear of losing control or going crazy.  Fear of dying. Some of these symptoms can mimic serious medical conditions. For example, you may think you are having a heart attack. Although panic attacks can be very scary, they are not life threatening. DIAGNOSIS  Panic attacks are diagnosed through an assessment by your health care provider. Your health care provider will ask questions about your symptoms, such as where and when they occurred. Your health care provider will also ask about your medical history and use of alcohol and drugs, including prescription medicines. Your health care provider may order blood tests or other studies to rule out a serious medical condition. Your health care provider may refer you to a mental health professional for further evaluation. TREATMENT   Most healthy people who have one or two panic attacks in an extreme, life-threatening situation will not require treatment.  The treatment for panic attacks associated with anxiety disorders or other mental illness typically involves counseling with a mental health professional, medicine, or a combination of both. Your health care provider will help determine what treatment is best for you.  Panic attacks due to physical illness usually go away with treatment of the illness. If prescription medicine is causing panic attacks, talk with your health care provider about stopping the medicine, decreasing the dose, or substituting another medicine.  Panic attacks due to alcohol or drug abuse go away with abstinence. Some adults need professional help in order to stop drinking or using drugs. HOME CARE  INSTRUCTIONS   Take all medicines as directed by your health care provider.   Schedule and attend follow-up visits as directed by your health care provider. It is important to keep all your appointments. SEEK MEDICAL CARE IF:  You are not able to take your medicines as prescribed.  Your symptoms do not improve or get worse. SEEK IMMEDIATE MEDICAL CARE IF:   You experience panic attack symptoms that are different than your usual symptoms.  You have serious thoughts about hurting yourself or others.  You are taking medicine for panic attacks and have a serious side effect. MAKE SURE YOU:  Understand these instructions.  Will watch your condition.  Will get help right away if you are not doing well or get worse. Document Released: 12/22/2004 Document Revised: 12/27/2012 Document Reviewed: 08/05/2012 [facility] Patient Information 2015 [place], [place]. This information is not intended to replace advice given to you by your health care provider. Make sure you discuss any questions you have with your health care provider.

## 2014-06-08 NOTE — ED Provider Notes (Signed)
CSN: [id number]     Arrival date & time 06/08/14  0422 History   First MD Initiated Contact with Patient 06/08/14 [phone]     Chief Complaint  Patient presents with  . Dizziness     (Consider location/radiation/quality/duration/timing/severity/associated sxs/prior Treatment) HPI  This is a 39 year old female who presents with dizziness, bilateral hand tingling, and nausea. Patient reports that she woke up and felt like the room is spinning. Has a history of vertigo. Symptoms are worse with movement of her head. She subsequently had worsening bilateral hand tingling and facial tingling.  On evaluation, patient is hyperventilating. She has her eyes closed and states that opening her eyes but her dizziness worse. Denies any weakness, numbness, or difficulty ambulating. Patient reports recent URI symptoms including congestion and ear fullness. She has been taking Mucinex without relief.  Past Medical History  Diagnosis Date  . MRSA cellulitis of left foot     L foot  . Allergy   . Migraines   . Vertigo    Past Surgical History  Procedure Laterality Date  . Leep    . Cryo     Family History  Problem Relation Age of Onset  . Hyperlipidemia Mother   . Hypertension Mother   . Diabetes Other    History  Substance Use Topics  . Smoking status: Never Smoker   . Smokeless tobacco: Not on file  . Alcohol Use: No   OB History    No data available     Review of Systems  Constitutional: Negative for fever.  HENT: Positive for congestion and ear pain.   Respiratory: Negative for cough, chest tightness and shortness of breath.   Cardiovascular: Negative for chest pain.  Gastrointestinal: Negative for nausea, vomiting and abdominal pain.  Genitourinary: Negative for dysuria.  Musculoskeletal: Negative for back pain.  Neurological: Positive for dizziness and numbness. Negative for syncope and headaches.  Psychiatric/Behavioral: Negative for confusion. The patient is nervous/anxious.    All other systems reviewed and are negative.     Allergies  Doxycycline; Latex; Shellfish allergy; Yeast-related products; and Ciprofloxacin  Home Medications   Prior to Admission medications   Medication Sig Start Date End Date Taking? Authorizing Provider  clonazePAM (KLONOPIN) 1 MG tablet Take 1/2 to 1 tablet by mouth twice a day as needed for anxiety or sleep 04/19/14  Yes Emi Belfasteborah B Gessner, FNP  PE-Diphenhydramine-DM-GG-APAP [facility](MUCINEX CHILD MS DAY-NIGHT CLD PO) Take 2 tablets by mouth 2 (two) times daily.   Yes Historical Provider, MD  zolpidem (AMBIEN) 10 MG tablet Take 1 tablet by mouth at bedtime. 05/22/14  Yes Historical Provider, MD  Clobetasol Propionate 0.05 % lotion Apply 1 application topically 2 (two) times daily. Use sparingly until rash gone plus one additional day, maximum 10 days. Patient not taking: Reported on 06/08/2014 03/22/14   Emi Belfasteborah B Gessner, FNP  diazepam (VALIUM) 5 MG tablet Take 1 tablet (5 mg total) by mouth every 8 (eight) hours as needed (vertigo). 06/08/14   Shon Batonourtney F Judieth Mckown, MD  fluticasone (FLONASE) 50 MCG/ACT nasal spray Place 2 sprays into both nostrils daily. 06/08/14   Shon Batonourtney F Ravis Herne, MD  hydrocortisone 2.5 % lotion Apply sparingly to affected area twice a day for maximum 10 days. Patient not taking: Reported on 06/08/2014 03/22/14   Emi Belfasteborah B Gessner, FNP  meclizine (ANTIVERT) 25 MG tablet Take 1 tablet (25 mg total) by mouth 3 (three) times daily as needed for dizziness. 06/08/14   Shon Batonourtney F Charlestine Rookstool, MD  sodium chloride Leotis Shames(OCEAN)  0.65 % SOLN nasal spray Place 1 spray into both nostrils as needed for congestion. 06/08/14   Shon Baton, MD  SUMAtriptan (IMITREX) 100 MG tablet Take 1 tablet (100 mg total) by mouth once. May repeat in 2 hours if headache persists or recurs. Patient not taking: Reported on 06/08/2014 10/10/13   Carmelina Dane, MD   BP 146/80 mmHg  Pulse 101  Temp(Src) 98.2 F (36.8 C) (Oral)  Resp 22  SpO2 100%  LMP 04/20/2014  (Approximate) Physical Exam  Constitutional: She is oriented to person, place, and time. No distress.  Hyperventilating, eyes closed  HENT:  Head: Normocephalic and atraumatic.  Fullness behind bilateral ears, no purulence, dull light reflex  Eyes: EOM are normal. Pupils are equal, round, and reactive to light.  Horizontal nystagmus noted  Cardiovascular: Normal rate, regular rhythm and normal heart sounds.   Pulmonary/Chest: Effort normal and breath sounds normal. No respiratory distress. She has no wheezes.  Abdominal: Soft. Bowel sounds are normal.  Neurological: She is alert and oriented to person, place, and time.  Cranial nerves II through XII intact, 5 out of 5 strength in all 4 strategies, no ataxia with gait  Skin: Skin is warm and dry.  Psychiatric: She has a normal mood and affect.  Nursing note and vitals reviewed.   ED Course  Procedures (including critical care time) Labs Review Labs Reviewed - No data to display  Imaging Review No results found.   EKG Interpretation   Date/Time:  Friday June 08 2014 04:56:43 EDT Ventricular Rate:  84 PR Interval:  133 QRS Duration: 79 QT Interval:  383 QTC Calculation: 453 R Axis:   74 Text Interpretation:  Sinus rhythm Right atrial enlargement No significant  change since last tracing Confirmed by Lerline Valdivia  MD, Makyi Ledo ([id number]) on  06/08/2014 6:21:06 AM      MDM   Final diagnoses:  Vertigo  Panic attack  URI (upper respiratory infection)    Patient presents with dizziness and tingly feeling in her hands. History of vertigo. Has nystagmus and is very nauseous. Exam and history suggestive of peripheral vertigo. Patient also reporting tingling in her hands and is actively hyperventilating. Suspect component of anxiety or panic attack. Patient given Valium and meclizine as well as Zofran. On recheck, patient reports symptoms of almost completely resolved. She still endorses some dizziness with turning her head but her  numbness and nausea have improved.  Patient likely has vertigo secondary to her upper respiratory infection. Will discharge with meclizine, Valium, and nasal sterile and saline. Suspect tingling in the hands and numbness in the face likely secondary to panic attack. Patient improved.  After history, exam, and medical workup I feel the patient has been appropriately medically screened and is safe for discharge home. Pertinent diagnoses were discussed with the patient. Patient was given return precautions.     Shon Baton, MD 06/08/14 [phone]

## 2014-06-08 NOTE — ED Notes (Signed)
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

## 2014-07-09 ENCOUNTER — Telehealth: Payer: 59 | Admitting: Family

## 2014-07-09 DIAGNOSIS — J019 Acute sinusitis, unspecified: Secondary | ICD-10-CM

## 2014-07-09 MED ORDER — FLUCONAZOLE 150 MG PO TABS: 150.0000 mg | ORAL_TABLET | Freq: Once | ORAL | Status: DC

## 2014-07-09 MED ORDER — AMOXICILLIN-POT CLAVULANATE 875-125 MG PO TABS: 1.0000 | ORAL_TABLET | Freq: Two times a day (BID) | ORAL | Status: DC

## 2014-07-09 NOTE — Addendum Note (Signed)
Addended by: Jannifer RodneyHAWKS, CHRISTY A on: 07/09/2014 10:55 AM   Modules accepted: Orders

## 2014-07-09 NOTE — Progress Notes (Signed)

## 2014-07-12 ENCOUNTER — Telehealth: Payer: 59 | Admitting: Physician Assistant

## 2014-07-12 ENCOUNTER — Encounter (HOSPITAL_COMMUNITY): Payer: Self-pay

## 2014-07-12 ENCOUNTER — Other Ambulatory Visit: Payer: Self-pay | Admitting: Family Medicine

## 2014-07-12 DIAGNOSIS — H9209 Otalgia, unspecified ear: Secondary | ICD-10-CM | POA: Insufficient documentation

## 2014-07-12 DIAGNOSIS — Z8614 Personal history of Methicillin resistant Staphylococcus aureus infection: Secondary | ICD-10-CM | POA: Diagnosis not present

## 2014-07-12 DIAGNOSIS — G43909 Migraine, unspecified, not intractable, without status migrainosus: Secondary | ICD-10-CM | POA: Diagnosis not present

## 2014-07-12 DIAGNOSIS — Z79899 Other long term (current) drug therapy: Secondary | ICD-10-CM | POA: Diagnosis not present

## 2014-07-12 DIAGNOSIS — H53149 Visual discomfort, unspecified: Secondary | ICD-10-CM | POA: Insufficient documentation

## 2014-07-12 DIAGNOSIS — R112 Nausea with vomiting, unspecified: Secondary | ICD-10-CM | POA: Insufficient documentation

## 2014-07-12 DIAGNOSIS — R197 Diarrhea, unspecified: Secondary | ICD-10-CM | POA: Diagnosis not present

## 2014-07-12 DIAGNOSIS — Z91040 Latex allergy status: Secondary | ICD-10-CM | POA: Insufficient documentation

## 2014-07-12 DIAGNOSIS — Z3202 Encounter for pregnancy test, result negative: Secondary | ICD-10-CM | POA: Insufficient documentation

## 2014-07-12 DIAGNOSIS — B9689 Other specified bacterial agents as the cause of diseases classified elsewhere: Secondary | ICD-10-CM

## 2014-07-12 DIAGNOSIS — T50905A Adverse effect of unspecified drugs, medicaments and biological substances, initial encounter: Secondary | ICD-10-CM

## 2014-07-12 DIAGNOSIS — F419 Anxiety disorder, unspecified: Secondary | ICD-10-CM | POA: Insufficient documentation

## 2014-07-12 DIAGNOSIS — J019 Acute sinusitis, unspecified: Secondary | ICD-10-CM

## 2014-07-12 LAB — CBC WITH DIFFERENTIAL/PLATELET
BASOS ABS: 0 10*3/uL (ref 0.0–0.1)
Basophils Relative: 0 % (ref 0–1)
EOS ABS: 0.1 10*3/uL (ref 0.0–0.7)
Eosinophils Relative: 1 % (ref 0–5)
HCT: 37.6 % (ref 36.0–46.0)
HEMOGLOBIN: 12.2 g/dL (ref 12.0–15.0)
Lymphocytes Relative: 23 % (ref 12–46)
Lymphs Abs: 1.9 10*3/uL (ref 0.7–4.0)
MCH: 24 pg — ABNORMAL LOW (ref 26.0–34.0)
MCHC: 32.4 g/dL (ref 30.0–36.0)
MCV: 74 fL — ABNORMAL LOW (ref 78.0–100.0)
MONOS PCT: 4 % (ref 3–12)
Monocytes Absolute: 0.3 10*3/uL (ref 0.1–1.0)
NEUTROS PCT: 72 % (ref 43–77)
Neutro Abs: 6 10*3/uL (ref 1.7–7.7)
Platelets: 402 10*3/uL — ABNORMAL HIGH (ref 150–400)
RBC: 5.08 MIL/uL (ref 3.87–5.11)
RDW: 13.1 % (ref 11.5–15.5)
WBC: 8.3 10*3/uL (ref 4.0–10.5)

## 2014-07-12 LAB — URINE MICROSCOPIC-ADD ON

## 2014-07-12 LAB — COMPREHENSIVE METABOLIC PANEL
ALT: 13 U/L — AB (ref 14–54)
AST: 24 U/L (ref 15–41)
Albumin: 4.8 g/dL (ref 3.5–5.0)
Alkaline Phosphatase: 54 U/L (ref 38–126)
Anion gap: 16 — ABNORMAL HIGH (ref 5–15)
BILIRUBIN TOTAL: 0.5 mg/dL (ref 0.3–1.2)
BUN: <5 mg/dL — ABNORMAL LOW (ref 6–20)
CHLORIDE: 100 mmol/L — AB (ref 101–111)
CO2: 19 mmol/L — ABNORMAL LOW (ref 22–32)
Calcium: 10.5 mg/dL — ABNORMAL HIGH (ref 8.9–10.3)
Creatinine, Ser: 1.03 mg/dL — ABNORMAL HIGH (ref 0.44–1.00)
GFR calc Af Amer: >60 mL/min (ref >60)
GFR calc non Af Amer: >60 mL/min (ref >60)
GLUCOSE: 149 mg/dL — AB (ref 65–99)
POTASSIUM: 3.5 mmol/L (ref 3.5–5.1)
SODIUM: 135 mmol/L (ref 135–145)
TOTAL PROTEIN: 9.1 g/dL — AB (ref 6.5–8.1)

## 2014-07-12 LAB — URINALYSIS, ROUTINE W REFLEX MICROSCOPIC
Bilirubin Urine: NEGATIVE
Glucose, UA: NEGATIVE mg/dL
Hgb urine dipstick: NEGATIVE
Ketones, ur: 40 mg/dL — AB
NITRITE: NEGATIVE
PROTEIN: NEGATIVE mg/dL
Specific Gravity, Urine: 1.008 (ref 1.005–1.030)
UROBILINOGEN UA: 0.2 mg/dL (ref 0.0–1.0)
pH: 7.5 (ref 5.0–8.0)

## 2014-07-12 LAB — POC URINE PREG, ED: PREG TEST UR: NEGATIVE

## 2014-07-12 MED ORDER — SULFAMETHOXAZOLE-TRIMETHOPRIM 800-160 MG PO TABS: 1.0000 | ORAL_TABLET | Freq: Two times a day (BID) | ORAL | Status: DC

## 2014-07-12 MED ORDER — ONDANSETRON HCL 4 MG PO TABS: 4.0000 mg | ORAL_TABLET | Freq: Three times a day (TID) | ORAL | Status: DC | PRN

## 2014-07-12 MED ORDER — ONDANSETRON 4 MG PO TBDP
ORAL_TABLET | ORAL | Status: AC
  Filled 2014-07-12: qty 2

## 2014-07-12 MED ORDER — ONDANSETRON 4 MG PO TBDP
8.0000 mg | ORAL_TABLET | Freq: Once | ORAL | Status: AC
  Administered 2014-07-12: 8 mg via ORAL

## 2014-07-12 NOTE — Addendum Note (Signed)
Addended by: Marcelline MatesMARTIN, Charlesia Canaday on: 07/12/2014 07:36 PM   Modules accepted: Orders

## 2014-07-12 NOTE — Progress Notes (Signed)
We are sorry that you are not feeling well.  Here is how we plan to help!  Based on what you have shared with me it looks like you have sinusitis as previously diagnosed. You are allergic to doxycycline and Fluoroquinolones (Ciprofloxacin). Those in addition to Augmentin (your current antibiotic) are the best antibiotic choices for sinusitis. You absolutely need to stop the current antibiotic so the side effects will go away. Stay well hydrated and rest. If you cannot stay hydrated, please go directly to the ER or an Urgent Care. I will send you in a prescription for Bactrim to take twice daily for 7 days to finish treatment for sinusitis. If symptoms are not resolving, please see a provider for examination and further assessment. I hope you feel better.  Sinus infections are not as easily transmitted as other respiratory infection, however we still recommend that you avoid close contact with loved ones, especially the very young and elderly.  Remember to wash your hands thoroughly throughout the day as this is the number one way to prevent the spread of infection!  Home Care:  Only take medications as instructed by your medical team.  Complete the entire course of an antibiotic.  Do not take these medications with alcohol.  A steam or ultrasonic humidifier can help congestion.  You can place a towel over your head and breathe in the steam from hot water coming from a faucet.  Avoid close contacts especially the very young and the elderly.  Cover your mouth when you cough or sneeze.  Always remember to wash your hands.  Get Help Right Away If:  You develop worsening fever or sinus pain.  You develop a severe head ache or visual changes.  Your symptoms persist after you have completed your treatment plan.  Make sure you  Understand these instructions.  Will watch your condition.  Will get help right away if you are not doing well or get worse.  Your e-visit answers were reviewed  by a board certified advanced clinical practitioner to complete your personal care plan.  Depending on the condition, your plan could have included both over the counter or prescription medications.  If there is a problem please reply  once you have received a response from your provider.  Your safety is important to us.  If you have drug allergies check your prescription carefully.    You can use MyChart to ask questions about today's visit, request a non-urgent call back, or ask for a work or school excuse.  You will get an e-mail in the next two days asking about your experience.  I hope that your e-visit has been valuable and will speed your recovery. Thank you for using e-visits.

## 2014-07-12 NOTE — ED Notes (Signed)
Pt reports ongoing abdominal cramping, nausea, vomiting, diarrhea, and dizziness that started on Tuesday. She says she has vertigo. Pt arrived to triage hyperventilating. She says she has an upper respiratory infection, was prescribed augmentin for this.

## 2014-07-13 ENCOUNTER — Emergency Department (HOSPITAL_COMMUNITY)
Admission: EM | Admit: 2014-07-13 | Discharge: 2014-07-13 | Disposition: A | Discharge status: Home / Self Care | Payer: 59 | Attending: Emergency Medicine | Admitting: Emergency Medicine

## 2014-07-13 DIAGNOSIS — R1110 Vomiting, unspecified: Secondary | ICD-10-CM

## 2014-07-13 DIAGNOSIS — F419 Anxiety disorder, unspecified: Secondary | ICD-10-CM

## 2014-07-13 DIAGNOSIS — R197 Diarrhea, unspecified: Secondary | ICD-10-CM

## 2014-07-13 DIAGNOSIS — R519 Headache, unspecified: Secondary | ICD-10-CM

## 2014-07-13 DIAGNOSIS — R51 Headache: Secondary | ICD-10-CM

## 2014-07-13 MED ORDER — SODIUM CHLORIDE 0.9 % IV BOLUS (SEPSIS)
1000.0000 mL | Freq: Once | INTRAVENOUS | Status: AC
  Administered 2014-07-13: 1000 mL via INTRAVENOUS

## 2014-07-13 MED ORDER — DIAZEPAM 5 MG PO TABS: 5.0000 mg | ORAL_TABLET | Freq: Three times a day (TID) | ORAL | Status: DC | PRN

## 2014-07-13 MED ORDER — METOCLOPRAMIDE HCL 5 MG/ML IJ SOLN
10.0000 mg | Freq: Once | INTRAMUSCULAR | Status: AC
  Administered 2014-07-13: 10 mg via INTRAVENOUS
  Filled 2014-07-13: qty 2

## 2014-07-13 MED ORDER — METOCLOPRAMIDE HCL 10 MG PO TABS: 10.0000 mg | ORAL_TABLET | Freq: Four times a day (QID) | ORAL | Status: DC | PRN

## 2014-07-13 MED ORDER — LORAZEPAM 2 MG/ML IJ SOLN
0.5000 mg | Freq: Once | INTRAMUSCULAR | Status: AC
  Administered 2014-07-13: 0.5 mg via INTRAVENOUS
  Filled 2014-07-13: qty 1

## 2014-07-13 NOTE — ED Notes (Signed)
Patient presents reporting nausea/vomiting/diarrhea for the past 2 days. Also, reports dizziness and panic attacks.

## 2014-07-13 NOTE — Discharge Instructions (Signed)
Stop taking the antibiotic. All and make appointment to follow-up with your primary physician.   Nausea and Vomiting Nausea means you feel sick to your stomach. Throwing up (vomiting) is a reflex where stomach contents come out of your mouth. HOME CARE   Take medicine as told by your doctor.  Do not force yourself to eat. However, you do need to drink fluids.  If you feel like eating, eat a normal diet as told by your doctor.  Eat rice, wheat, potatoes, bread, lean meats, yogurt, fruits, and vegetables.  Avoid high-fat foods.  Drink enough fluids to keep your pee (urine) clear or pale yellow.  Ask your doctor how to replace body fluid losses (rehydrate). Signs of body fluid loss (dehydration) include:  Feeling very thirsty.  Dry lips and mouth.  Feeling dizzy.  Dark pee.  Peeing less than normal.  Feeling confused.  Fast breathing or heart rate. GET HELP RIGHT AWAY IF:   You have blood in your throw up.  You have black or bloody poop (stool).  You have a bad headache or stiff neck.  You feel confused.  You have bad belly (abdominal) pain.  You have chest pain or trouble breathing.  You do not pee at least once every 8 hours.  You have cold, clammy skin.  You keep throwing up after 24 to 48 hours.  You have a fever. MAKE SURE YOU:   Understand these instructions.  Will watch your condition.  Will get help right away if you are not doing well or get worse. Document Released: 06/10/2007 Document Revised: 03/16/2011 Document Reviewed: 05/23/2010 [facility] Patient Information 2015 [place], [place]. This information is not intended to replace advice given to you by your health care provider. Make sure you discuss any questions you have with your health care provider.  Migraine Headache A migraine headache is very bad, throbbing pain on one or both sides of your head. Talk to your doctor about what things may bring on (trigger) your migraine headaches. HOME  CARE  Only take medicines as told by your doctor.  Lie down in a dark, quiet room when you have a migraine.  Keep a journal to find out if certain things bring on migraine headaches. For example, write down:  What you eat and drink.  How much sleep you get.  Any change to your diet or medicines.  Lessen how much alcohol you drink.  Quit smoking if you smoke.  Get enough sleep.  Lessen any stress in your life.  Keep lights dim if bright lights bother you or make your migraines worse. GET HELP RIGHT AWAY IF:   Your migraine becomes really bad.  You have a fever.  You have a stiff neck.  You have trouble seeing.  Your muscles are weak, or you lose muscle control.  You lose your balance or have trouble walking.  You feel like you will pass out (faint), or you pass out.  You have really bad symptoms that are different than your first symptoms. MAKE SURE YOU:   Understand these instructions.  Will watch your condition.  Will get help right away if you are not doing well or get worse. Document Released: 10/01/2007 Document Revised: 03/16/2011 Document Reviewed: 08/29/2012 [facility] Patient Information 2015 [place], [place]. This information is not intended to replace advice given to you by your health care provider. Make sure you discuss any questions you have with your health care provider.  Panic Attacks Panic attacks are sudden, short-livedsurges of severe  anxiety, fear, or discomfort. They may occur for no reason when you are relaxed, when you are anxious, or when you are sleeping. Panic attacks may occur for a number of reasons:   Healthy people occasionally have panic attacks in extreme, life-threatening situations, such as war or natural disasters. Normal anxiety is a protective mechanism of the body that helps [place] react to danger (fight or flight response).  Panic attacks are often seen with anxiety disorders, such as panic disorder, social anxiety disorder,  generalized anxiety disorder, and phobias. Anxiety disorders cause excessive or uncontrollable anxiety. They may interfere with your relationships or other life activities.  Panic attacks are sometimes seen with other mental illnesses, such as depression and posttraumatic stress disorder.  Certain medical conditions, prescription medicines, and drugs of abuse can cause panic attacks. SYMPTOMS  Panic attacks start suddenly, peak within 20 minutes, and are accompanied by four or more of the following symptoms:  Pounding heart or fast heart rate (palpitations).  Sweating.  Trembling or shaking.  Shortness of breath or feeling smothered.  Feeling choked.  Chest pain or discomfort.  Nausea or strange feeling in your stomach.  Dizziness, light-headedness, or feeling like you will faint.  Chills or hot flushes.  Numbness or tingling in your lips or hands and feet.  Feeling that things are not real or feeling that you are not yourself.  Fear of losing control or going crazy.  Fear of dying. Some of these symptoms can mimic serious medical conditions. For example, you may think you are having a heart attack. Although panic attacks can be very scary, they are not life threatening. DIAGNOSIS  Panic attacks are diagnosed through an assessment by your health care provider. Your health care provider will ask questions about your symptoms, such as where and when they occurred. Your health care provider will also ask about your medical history and use of alcohol and drugs, including prescription medicines. Your health care provider may order blood tests or other studies to rule out a serious medical condition. Your health care provider may refer you to a mental health professional for further evaluation. TREATMENT   Most healthy people who have one or two panic attacks in an extreme, life-threatening situation will not require treatment.  The treatment for panic attacks associated with  anxiety disorders or other mental illness typically involves counseling with a mental health professional, medicine, or a combination of both. Your health care provider will help determine what treatment is best for you.  Panic attacks due to physical illness usually go away with treatment of the illness. If prescription medicine is causing panic attacks, talk with your health care provider about stopping the medicine, decreasing the dose, or substituting another medicine.  Panic attacks due to alcohol or drug abuse go away with abstinence. Some adults need professional help in order to stop drinking or using drugs. HOME CARE INSTRUCTIONS   Take all medicines as directed by your health care provider.   Schedule and attend follow-up visits as directed by your health care provider. It is important to keep all your appointments. SEEK MEDICAL CARE IF:  You are not able to take your medicines as prescribed.  Your symptoms do not improve or get worse. SEEK IMMEDIATE MEDICAL CARE IF:   You experience panic attack symptoms that are different than your usual symptoms.  You have serious thoughts about hurting yourself or others.  You are taking medicine for panic attacks and have a serious side effect. MAKE SURE YOU:  Understand these instructions.  Will watch your condition.  Will get help right away if you are not doing well or get worse. Document Released: 12/22/2004 Document Revised: 12/27/2012 Document Reviewed: 08/05/2012 [facility] Patient Information 2015 [place], [place]. This information is not intended to replace advice given to you by your health care provider. Make sure you discuss any questions you have with your health care provider.

## 2014-07-13 NOTE — Telephone Encounter (Signed)
Please let patient know that refill was called in to her pharmacy. She needs to make an appointment to be seen prior to further refills.

## 2014-07-13 NOTE — ED Provider Notes (Signed)
CSN: [id number]     Arrival date & time 07/12/14  2118 History  This chart was scribed for [name], MD by Freida Busman, ED Scribe. This patient was seen in room D33C/D33C and the patient's care was started 1:35 AM.    Chief Complaint  Patient presents with  . Emesis   The history is provided by the patient. No language interpreter was used.   HPI Comments:  Jasmine Jennings is a 39 y.o. female with PMHx of vertigo and migraines, who presents to the Emergency Department complaining of nausea, vomiting, and diarrhea for 3 days. Pt was diagnosed with sinusitis 4 days ago and started on amoxicillin. She notes her symptoms began on the second day of course; her last dose was 24 hours ago. Pt also complains of room spinning dizziness, HA, ear fullness and photophobia. She notes she often experiences both nausea and vomiting with her episodes of vertigo. No alleviating factors noted.  Past Medical History  Diagnosis Date  . MRSA cellulitis of left foot     L foot  . Allergy   . Migraines   . Vertigo    Past Surgical History  Procedure Laterality Date  . Leep    . Cryo     Family History  Problem Relation Age of Onset  . Hyperlipidemia Mother   . Hypertension Mother   . Diabetes Other    History  Substance Use Topics  . Smoking status: Never Smoker   . Smokeless tobacco: Not on file  . Alcohol Use: No   OB History    No data available     Review of Systems  Constitutional: Negative for fever and chills.  HENT: Positive for ear pain.   Eyes: Positive for photophobia. Negative for visual disturbance.  Respiratory: Negative for cough and shortness of breath.   Cardiovascular: Negative for chest pain.  Gastrointestinal: Positive for nausea, vomiting and diarrhea. Negative for abdominal pain, constipation and blood in stool.  Musculoskeletal: Negative for back pain, neck pain and neck stiffness.  Skin: Negative for rash and wound.  Neurological: Positive for dizziness  and headaches. Negative for weakness, light-headedness and numbness.  All other systems reviewed and are negative.     Allergies  Doxycycline; Latex; Shellfish allergy; Yeast-related products; and Ciprofloxacin  Home Medications   Prior to Admission medications   Medication Sig Start Date End Date Taking? Authorizing Provider  Clobetasol Propionate 0.05 % lotion Apply 1 application topically 2 (two) times daily. Use sparingly until rash gone plus one additional day, maximum 10 days. Patient not taking: Reported on 06/08/2014 03/22/14   Emi Belfast, FNP  clonazePAM Scarlette Calico) 1 MG tablet Take 1/2 to 1 tablet by mouth twice a day as needed for anxiety or sleep 04/19/14   Emi Belfast, FNP  diazepam (VALIUM) 5 MG tablet Take 1 tablet (5 mg total) by mouth every 8 (eight) hours as needed (vertigo). 07/13/14   [name], MD  fluconazole (DIFLUCAN) 150 MG tablet Take 1 tablet (150 mg total) by mouth once. 07/09/14   Junie Spencer, FNP  fluticasone (FLONASE) 50 MCG/ACT nasal spray Place 2 sprays into both nostrils daily. 06/08/14   Shon Baton, MD  hydrocortisone 2.5 % lotion Apply sparingly to affected area twice a day for maximum 10 days. Patient not taking: Reported on 06/08/2014 03/22/14   Emi Belfast, FNP  meclizine (ANTIVERT) 25 MG tablet Take 1 tablet (25 mg total) by mouth 3 (three) times daily as needed  for dizziness. 06/08/14   Shon Baton, MD  metoCLOPramide (REGLAN) 10 MG tablet Take 1 tablet (10 mg total) by mouth every 6 (six) hours as needed for nausea or vomiting. 07/13/14   [name], MD  ondansetron (ZOFRAN) 4 MG tablet Take 1 tablet (4 mg total) by mouth every 8 (eight) hours as needed for nausea or vomiting. 07/12/14   Waldon Merl, PA-C  PE-Diphenhydramine-DM-GG-APAP [facility] CHILD MS DAY-NIGHT CLD PO) Take 2 tablets by mouth 2 (two) times daily.    Historical Provider, MD  sodium chloride (OCEAN) 0.65 % SOLN nasal spray Place 1 spray into both  nostrils as needed for congestion. 06/08/14   Shon Baton, MD  sulfamethoxazole-trimethoprim (BACTRIM DS,SEPTRA DS) 800-160 MG per tablet Take 1 tablet by mouth 2 (two) times daily. 07/12/14   Waldon Merl, PA-C  SUMAtriptan (IMITREX) 100 MG tablet Take 1 tablet (100 mg total) by mouth once. May repeat in 2 hours if headache persists or recurs. Patient not taking: Reported on 06/08/2014 10/10/13   Carmelina Dane, MD  zolpidem (AMBIEN) 10 MG tablet Take 1 tablet by mouth at bedtime. 05/22/14   Historical Provider, MD   BP 109/63 mmHg  Pulse 77  Temp(Src) 98.1 F (36.7 C) (Oral)  Resp 13  Ht  (1.727 m)  Wt 171 lb 3 oz (77.65 kg)  BMI 26.03 kg/m2  SpO2 100%  LMP 06/12/2014 Physical Exam  Constitutional: She is oriented to person, place, and time. She appears well-developed and well-nourished. No distress.  Anxious appearing. Hyperventilating.  HENT:  Head: Normocephalic and atraumatic.  Mouth/Throat: Oropharynx is clear and moist. No oropharyngeal exudate.  No sinus tenderness with percussion. Mild bilateral TM fullness without erythema.  Eyes: EOM are normal. Pupils are equal, round, and reactive to light.  No nystagmus  Neck: Normal range of motion. Neck supple.  Cardiovascular: Normal rate and regular rhythm.   Pulmonary/Chest: Breath sounds normal. No respiratory distress. She has no wheezes. She has no rales. She exhibits no tenderness.  Tachypnea  Abdominal: Soft. Bowel sounds are normal. She exhibits no distension and no mass. There is no tenderness. There is no rebound and no guarding.  Musculoskeletal: Normal range of motion. She exhibits no edema or tenderness.  Neurological: She is alert and oriented to person, place, and time.  Patient is alert and oriented x3 with clear, goal oriented speech. Patient has 5/5 motor in all extremities. Sensation is intact to light touch. Bilateral finger-to-nose is normal with no signs of dysmetria. Patient has a normal gait and  walks without assistance.  Skin: Skin is warm and dry. No rash noted. No erythema.  Psychiatric:  Anxious appearing  Nursing note and vitals reviewed.   ED Course  Procedures  DIAGNOSTIC STUDIES:  Oxygen Saturation is 100% on RA, normal by my interpretation.    COORDINATION OF CARE:  1:43 AM Discussed treatment plan with pt at bedside and pt agreed to plan.  Labs Review Labs Reviewed  CBC WITH DIFFERENTIAL/PLATELET - Abnormal; Notable for the following:    MCV 74.0 (*)    MCH 24.0 (*)    Platelets 402 (*)    All other components within normal limits  COMPREHENSIVE METABOLIC PANEL - Abnormal; Notable for the following:    Chloride 100 (*)    CO2 19 (*)    Glucose, Bld 149 (*)    BUN <5 (*)    Creatinine, Ser 1.03 (*)    Calcium 10.5 (*)  Total Protein 9.1 (*)    ALT 13 (*)    Anion gap 16 (*)    All other components within normal limits  URINALYSIS, ROUTINE W REFLEX MICROSCOPIC (NOT [facility]) - Abnormal; Notable for the following:    APPearance CLOUDY (*)    Ketones, ur 40 (*)    Leukocytes, UA TRACE (*)    All other components within normal limits  URINE MICROSCOPIC-ADD ON - Abnormal; Notable for the following:    Squamous Epithelial / LPF FEW (*)    All other components within normal limits  POC URINE PREG, ED    Imaging Review No results found.   EKG Interpretation None      MDM   Final diagnoses:  Anxiety  Vomiting and diarrhea  Nonintractable headache, unspecified chronicity pattern, unspecified headache type    I personally performed the services described in this documentation, which was scribed in my presence. The recorded information has been reviewed and is accurate.  Patient states she is feeling much better. No further vomiting in the emergency department. Nonfocal neurologic exam. We'll discharge home to follow-up with her primary physician. Return precautions given.  Loren Raceravid Fayth Trefry, MD 07/13/14 [phone]

## 2014-07-18 ENCOUNTER — Telehealth: Payer: Self-pay | Admitting: *Deleted

## 2014-07-18 NOTE — Telephone Encounter (Signed)
Called pt to follow up on E-visit on 07/09/14 and 07/12/14.  On 07/09/14 she was prescribed Augmentin and started vomiting.  On 07/12/14 she had another E-visit and was told d/c the Augmentin and was put on Bactrim and Zofran.  She stated that she ended up going to the ER for vertigo symptoms and she was dehydrated from all of the vomiting. The ER physician stated that she had an ear infection and set her up to see an ENT on 08/24/14.  He instructed her to quit taking any antibiotics until seen by ENT.  She started feeling better on Sunday and went back to work yesterday.  She stated she loved the E-visits and this would not steer her away from ever doing them again.

## 2014-08-03 ENCOUNTER — Other Ambulatory Visit: Payer: Self-pay | Admitting: Family Medicine

## 2014-08-04 ENCOUNTER — Ambulatory Visit (INDEPENDENT_AMBULATORY_CARE_PROVIDER_SITE_OTHER): Payer: 59 | Admitting: Internal Medicine

## 2014-08-04 VITALS — BP 122/83 | HR 91 | Temp 98.5°F | Resp 18 | Ht 69.0 in | Wt 195.0 lb

## 2014-08-04 DIAGNOSIS — F32A Depression, unspecified: Secondary | ICD-10-CM | POA: Insufficient documentation

## 2014-08-04 DIAGNOSIS — F411 Generalized anxiety disorder: Secondary | ICD-10-CM | POA: Diagnosis not present

## 2014-08-04 DIAGNOSIS — G4700 Insomnia, unspecified: Secondary | ICD-10-CM

## 2014-08-04 DIAGNOSIS — F329 Major depressive disorder, single episode, unspecified: Secondary | ICD-10-CM

## 2014-08-04 MED ORDER — MIRTAZAPINE 30 MG PO TABS: 30.0000 mg | ORAL_TABLET | Freq: Every day | ORAL | Status: DC

## 2014-08-04 NOTE — Progress Notes (Signed)
Subjective:    Patient ID: Jasmine Jennings, female    DOB: Jan 27, 1975, 39 y.o.   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 She has not been able to sleep for the last 2 days. This is a common problem going on for the past 15 years. She has been treated with a number of agents including Ambien and Ambien CR and trazodone. Ambien CR worked the best but was unaffordable. Ambien worked for a while but has not been full over the last its months. She now gets absolutely no help with this area for insomnia is probably made worse by her generalized anxiety where she includes some daytime panic attacks she is use benzodiazepines for this but has never responded to an SSRI and has never been evaluated by psychiatry. She admits irritability and depression recently but feels like it is because she cannot sleep. Work is very stressful as a Engineer, civil (consulting). She has been evaluated for this and treated for this many times over the past 15 years and has had limited success. She is appropriately frustrated. Her quality of life has greatly deteriorated at this point.  She has been recently evaluated by a sleep specialist Dr. Earl Gala who felt she did not qualify for sleep study and could find no other things for her to do other than Ambien. This is no longer working. PCP--none but has Pending f/u visit w/ D Gessner FNP here See ER 6/3/and 7/8 and 2 evisits between. ENT appt coming up to f/u ER visit--not vertig today  Patient Active Problem List   Diagnosis Date Noted  . Insomnia 08/04/2014  . GAD (generalized anxiety disorder) 08/04/2014  . Depression 08/04/2014  . Overweight 03/30/2013  . HLD (hyperlipidemia) 03/30/2013  . Allergic rhinitis 03/30/2013  . Vertigo 12/09/2011  . Headache(784.0) 12/09/2011  . Abnormal cytological findings in female genital organs 10/07/2011  . HPV test positive 06/30/2011  . Cannot sleep 03/28/2010   Outpatient Encounter Prescriptions as  of 08/04/2014  Medication Sig Note  . clonazePAM (KLONOPIN) 1 MG tablet TAKE 1/2 TO 1 TABLET BY MOUTH TWICE DAILY AS NEEDED FOR ANXIETY/ SLEEP   . fluticasone (FLONASE) 50 MCG/ACT nasal spray Place 2 sprays into both nostrils daily.   . meclizine (ANTIVERT) 25 MG tablet Take 1 tablet (25 mg total) by mouth 3 (three) times daily as needed for dizziness.   . metoCLOPramide (REGLAN) 10 MG tablet Take 1 tablet (10 mg total) by mouth every 6 (six) hours as needed for nausea or vomiting.   . ondansetron (ZOFRAN) 4 MG tablet Take 1 tablet (4 mg total) by mouth every 8 (eight) hours as needed for nausea or vomiting.   . Clobetasol Propionate 0.05 % lotion Apply 1 application topically 2 (two) times daily. Use sparingly until rash gone plus one additional day, maximum 10 days. (Patient not taking: Reported on 06/08/2014)   . diazepam (VALIUM) 5 MG tablet Take 1 tablet (5 mg total) by mouth every 8 (eight) hours as needed (vertigo). (Patient not taking: Reported on 08/04/2014)   . sodium chloride (OCEAN) 0.65 % SOLN nasal spray Place 1 spray into both nostrils as needed for congestion. (Patient not taking: Reported on 08/04/2014)   . sulfamethoxazole-trimethoprim (BACTRIM DS,SEPTRA DS) 800-160 MG per tablet Take 1 tablet by mouth 2 (two) times daily. (Patient not taking: Reported on 08/04/2014)   . SUMAtriptan (IMITREX) 100 MG tablet Take 1 tablet (100 mg total) by mouth once. May repeat in 2 hours  if headache persists or recurs. (Patient not taking: Reported on 06/08/2014)   . zolpidem (AMBIEN) 10 MG tablet Take 1 tablet by mouth at bedtime. 06/08/2014: .      Past hx from 3/16: Her father who has Alzheimer's is currently living with her and she is in the process of having him evaluated to go into long term care. He sundowns and she has not been sleeping more than 3 hours a night on top of her existing difficulties with sleep. Review of Systems  Constitutional: Positive for fatigue. Negative for fever, activity  change, appetite change and unexpected weight change.  HENT: Negative for dental problem and trouble swallowing.   Eyes: Negative for visual disturbance.  Respiratory: Negative for chest tightness, shortness of breath and wheezing.   Cardiovascular: Negative for chest pain, palpitations and leg swelling.  Gastrointestinal: Negative for abdominal pain and constipation.  Genitourinary: Negative for difficulty urinating and menstrual problem.  Musculoskeletal: Negative for myalgias, back pain and neck pain.  Skin: Negative for rash.  Neurological: Positive for dizziness and headaches. Negative for tremors, speech difficulty, weakness and numbness.  Hematological: Does not bruise/bleed easily.  Psychiatric/Behavioral: Positive for sleep disturbance, dysphoric mood and agitation. Negative for suicidal ideas.       Objective:   Physical Exam  Constitutional: She is oriented to person, place, and time. She appears well-developed and well-nourished. No distress.  Eyes: Conjunctivae and EOM are normal. Pupils are equal, round, and reactive to light.  Neck: Normal range of motion.  Cardiovascular: Normal rate.   Pulmonary/Chest: Effort normal.  Musculoskeletal: Normal range of motion.  Neurological: She is alert and oriented to person, place, and time. No cranial nerve deficit.  Psychiatric:  Her mood is mildly anxious but her affect is normal giving all the things she is dealing with. Her thought content appear sound without flights of ideas. There are no other symptoms to suggest that her sleeplessness is due to manic behavior. Her judgment is sound.  BP 122/83 mmHg  Pulse 91  Temp(Src) 98.5 F (36.9 C) (Oral)  Resp 18  Ht 5\' 9"  (1.753 m)  Wt 195 lb (88.451 kg)  BMI 28.78 kg/m2  SpO2 97%  LMP 06/07/2014      Assessment & Plan:  Insomnia -chronic  Trial belsomra 10x 10d,then 15x10d if not successful, then 20 x10days if higher dose needed   F/u at that point  GAD (generalized  anxiety disorder) -Depression   Contin benzos as prescribed  Start remeron--call if more dizzy  Set up referral to psychiatry for further dx/rx  She has a counselor she is using thru [facility]   Vertigo now stable--f/u ENT  Migraines--wait til psych issues stable to evaluate  Meds ordered this encounter  Medications  . mirtazapine (REMERON) 30 MG tablet    Sig: Take 1 tablet (30 mg total) by mouth at bedtime.    Dispense:  30 tablet    Refill:  1   I advise repeating her metabolic profile and CBC in another week or 2 as her ER lab values had an elevated protein causing her calcium and a slight elevation in creatinine inconsistent with her past history.

## 2014-09-11 ENCOUNTER — Other Ambulatory Visit: Payer: Self-pay | Admitting: Family Medicine

## 2014-09-13 ENCOUNTER — Other Ambulatory Visit: Payer: Self-pay | Admitting: Family Medicine

## 2014-09-13 MED ORDER — CLONAZEPAM 1 MG PO TABS: ORAL_TABLET | ORAL | Status: DC

## 2014-11-20 ENCOUNTER — Other Ambulatory Visit: Payer: Self-pay | Admitting: Family Medicine

## 2014-11-20 NOTE — Telephone Encounter (Signed)
Prescription faxed to pharmacy.

## 2015-01-14 ENCOUNTER — Other Ambulatory Visit: Payer: Self-pay | Admitting: Family Medicine

## 2015-01-15 NOTE — Telephone Encounter (Signed)
Faxed to pharmacy

## 2015-02-28 ENCOUNTER — Other Ambulatory Visit: Payer: Self-pay | Admitting: Family Medicine

## 2015-03-02 NOTE — Telephone Encounter (Signed)
The last refill 1/17 specified that an office visit is needed for further refills.

## 2015-03-11 ENCOUNTER — Other Ambulatory Visit: Payer: Self-pay | Admitting: Family Medicine

## 2015-03-19 ENCOUNTER — Ambulatory Visit (INDEPENDENT_AMBULATORY_CARE_PROVIDER_SITE_OTHER): Payer: 59 | Admitting: Family Medicine

## 2015-03-19 VITALS — BP 120/80 | HR 96 | Temp 98.3°F | Resp 16 | Ht 69.0 in | Wt 206.0 lb

## 2015-03-19 DIAGNOSIS — J329 Chronic sinusitis, unspecified: Secondary | ICD-10-CM

## 2015-03-19 DIAGNOSIS — J069 Acute upper respiratory infection, unspecified: Secondary | ICD-10-CM

## 2015-03-19 DIAGNOSIS — R42 Dizziness and giddiness: Secondary | ICD-10-CM

## 2015-03-19 DIAGNOSIS — F410 Panic disorder [episodic paroxysmal anxiety] without agoraphobia: Secondary | ICD-10-CM | POA: Diagnosis not present

## 2015-03-19 DIAGNOSIS — Z8669 Personal history of other diseases of the nervous system and sense organs: Secondary | ICD-10-CM | POA: Diagnosis not present

## 2015-03-19 DIAGNOSIS — Z87892 Personal history of anaphylaxis: Secondary | ICD-10-CM

## 2015-03-19 DIAGNOSIS — G4700 Insomnia, unspecified: Secondary | ICD-10-CM | POA: Diagnosis not present

## 2015-03-19 MED ORDER — EPINEPHRINE 0.15 MG/0.15ML IJ SOAJ: 0.1500 mg | INTRAMUSCULAR | Status: DC | PRN

## 2015-03-19 MED ORDER — METOCLOPRAMIDE HCL 10 MG PO TABS: 10.0000 mg | ORAL_TABLET | Freq: Four times a day (QID) | ORAL | Status: DC | PRN

## 2015-03-19 MED ORDER — CLONAZEPAM 1 MG PO TABS
ORAL_TABLET | ORAL | Status: DC
Start: 2015-03-19 — End: 2022-05-28

## 2015-03-19 NOTE — Progress Notes (Signed)
Subjective:  This chart was scribed for Jasmine Jennings, by [facility] Demirci,scribe, at [facility].  This patient was seen in room 2 and the patient's care was started at 10:44 AM.    Patient ID: Jasmine Jennings, female    DOB: [birth year], 40 y.o.   MRN: [id number] Chief Complaint  Patient presents with  . Medication Refill    Clonazepam 1 mg, Epinephrine, Reglan 39m   . Sore Throat    x 2 days   . Nasal Congestion  . Sinus Problem    HPI  HPI Comments: Jasmine VALADEZis a 40y.o. female who presents to the [facility] for multiple concerns today. Patient does not have a PCP.   1. Refill of clonazepam: She was last seen by Dr. DLaney Pastorin July 2016.  She uses this for chronic insomnia. Per that note, she has used Ambien, Ambien CR, and trazodone. Symptoms worsened with generalized anxiety including day time panic attacks. Reportedly never responded to an SSRI and has never had an evaluation by psychiatry. She was [facility] for her to call in and schedule an appointment.  Apparently per that note she had been seen by a sleep specialists who thought she did not qualify for a sleep study. Referred to psychiatry for further diagnosis and treatment, continued counseling through her EAP. Was continued on Clonazepam and started Remeron 30 mgs QHS. Planned follow up with [facility]   ------- Patient takes Clonazepam 1 time per day at night.  Her last refill was in January. She has not yet met with her psychiatrist and feels that they would not be able to help her with the sleeping issues and panic attacks simultaneously.  She states that she has been having difficulty sleeping for years.  She has anxiety and panic attacks often (last one was last night- usually has them a couple times per month) and believes that they are being caused by the vertigo she is experiencing. She did not tolerate any SSRI's.  She has a  cSocial worker(EAP) and gets a call from them once a week but states that she has not met with anyone in the past (face to face meeting). These calls usually last close to 30 minutes when she does have these phone call sessions.  Denies drinking alcohol, using drugs or any thoughts of suicide/ harming others.   2. Request refill for epinephrine Patient is allergic to grass, trees, dust and yeast.  In the past, yeast has caused her an anaphylactic reaction.  She has not had any allergy shots. She is not currently being followed by an allergist but has been in the past.   3. Request refill for Reglan Patient states that she takes Reglan for her migraines (once or twice a month) when she feels nauseous.  She tried Zofran but states that it did not help her.    4. Nasal congestion:  Patient states that she has daily nasal congestion and states that they cause her to have vertigo intermittently (started about 6-7 years ago) with associated symptoms of pressure.  She was taking Flonase nasal spray for this issue but nothing else (last used- last week 2 sprays in each nostril).  She states that she no longer has any left and was not using it daily.  She feels that Flonase was not helping her at all which is a part of the reason why she did not use it  daily.  Patient has seen an allergist in the past for these issues but is not seeing one currently. She does not have an ENT. She had vertigo previously and was scheduled to go to an ENT but did not go to that appointment (unknown reasons).    Patient also states that she had a subjective fever (last night) with associated symptoms of chest congestion, chills and a sore throat onset 6 days ago.  She did not receive a flu shot last year. She works in the [employer] -works from home.  Patients 28 year old daughter is also sick at home with similar symptoms. She has taken Mucin ex and Ricola cough drops.  She has not tried salt water nasal spray.  She would not  like a flu test today.      Patient Active Problem List   Diagnosis Date Noted  . Insomnia 08/04/2014  . GAD (generalized anxiety disorder) 08/04/2014  . Depression 08/04/2014  . Overweight 03/30/2013  . HLD (hyperlipidemia) 03/30/2013  . Allergic rhinitis 03/30/2013  . Vertigo 12/09/2011  . Headache(784.0) 12/09/2011  . Abnormal cytological findings in female genital organs 10/07/2011  . HPV test positive 06/30/2011  . Cannot sleep 03/28/2010   Past Medical History  Diagnosis Date  . MRSA cellulitis of left foot     L foot  . Allergy   . Migraines   . Vertigo    Past Surgical History  Procedure Laterality Date  . Leep    . Cryo     Allergies  Allergen Reactions  . Amoxicillin Diarrhea and Nausea And Vomiting  . Doxycycline Other (See Comments)    Skin discoloration   . Latex   . Shellfish Allergy   . Yeast-Related Products   . Ciprofloxacin Rash   Prior to Admission medications   Medication Sig Start Date End Date Taking? Authorizing Provider  Butalbital-APAP-Caffeine 50-300-40 MG CAPS TAKE 1 CAPSULE EVERY 4 HOURS AS NEEDED FOR MIGRAINE. 12/10/14  Yes Historical Provider, MD  Clobetasol Propionate 0.05 % lotion Apply 1 application topically 2 (two) times daily. Use sparingly until rash gone plus one additional day, maximum 10 days. 03/22/14  Yes Elby Beck, FNP  clonazePAM (KLONOPIN) 1 MG tablet TAKE 1/2 TO 1 TABLET BY MOUTH DAILY AS NEEDED FOR ANXIETY OR SLEEP 01/15/15  Yes Elby Beck, FNP  diazepam (VALIUM) 5 MG tablet Take 1 tablet (5 mg total) by mouth every 8 (eight) hours as needed (vertigo). 07/13/14  Yes Julianne Rice, MD  EPINEPHRINE, ANAPHYLAXIS THERAPY AGENTS, Inject 0.3 mg into the muscle. 04/19/12  Yes Historical Provider, MD  fluconazole (DIFLUCAN) 150 MG tablet Take 1 tablet (150 mg total) by mouth once. 07/09/14  Yes Sharion Balloon, FNP  fluticasone (FLONASE) 50 MCG/ACT nasal spray Place 2 sprays into both nostrils daily. 06/08/14  Yes  Merryl Hacker, MD  hydrocortisone 2.5 % lotion Apply sparingly to affected area twice a day for maximum 10 days. 03/22/14  Yes Elby Beck, FNP  meclizine (ANTIVERT) 25 MG tablet Take 1 tablet (25 mg total) by mouth 3 (three) times daily as needed for dizziness. 06/08/14  Yes Merryl Hacker, MD  metoCLOPramide (REGLAN) 10 MG tablet Take 1 tablet (10 mg total) by mouth every 6 (six) hours as needed for nausea or vomiting. 07/13/14  Yes Julianne Rice, MD  mirtazapine (REMERON) 30 MG tablet Take 1 tablet (30 mg total) by mouth at bedtime. 08/04/14  Yes Leandrew Koyanagi, MD  PE-Diphenhydramine-DM-GG-APAP [facility]  CHILD MS DAY-NIGHT CLD PO) Take 2 tablets by mouth 2 (two) times daily.   Yes Historical Provider, MD  zolpidem (AMBIEN CR) 12.5 MG CR tablet Take 12.5 mg by mouth at bedtime. 12/25/14  Yes Historical Provider, MD  zolpidem (AMBIEN) 10 MG tablet Take 1 tablet by mouth at bedtime. 05/22/14  Yes Historical Provider, MD   Social History   Social History  . Marital Status: Single    Spouse Name: N/A  . Number of Children: N/A  . Years of Education: N/A   Occupational History  . Not on file.   Social History Main Topics  . Smoking status: Never Smoker   . Smokeless tobacco: Never Used  . Alcohol Use: No  . Drug Use: No  . Sexual Activity: No   Other Topics Concern  . Not on file   Social History Narrative     Review of Systems  Constitutional: Positive for fever and chills.  HENT: Positive for congestion, sinus pressure and sore throat.   Eyes: Negative for pain, redness and itching.  Respiratory: Negative for choking and shortness of breath.   Gastrointestinal: Negative for nausea and vomiting.  Musculoskeletal: Negative for neck pain and neck stiffness.  Neurological: Negative for seizures, syncope and speech difficulty.       Objective:   Physical Exam  Constitutional: She is oriented to person, place, and time. She appears well-developed and  well-nourished. No distress.  HENT:  Head: Normocephalic and atraumatic.  Right Ear: Hearing, tympanic membrane, external ear and ear canal normal.  Left Ear: Hearing, tympanic membrane, external ear and ear canal normal.  Nose: Nose normal.  Mouth/Throat: Oropharynx is clear and moist. No oropharyngeal exudate.  Maxillary sinus pressure,  minimal turbinate edema, no discharge.   Eyes: Conjunctivae and EOM are normal. Pupils are equal, round, and reactive to light.  Neck:  No lymphadenopathy.   Cardiovascular: Normal rate, regular rhythm, normal heart sounds and intact distal pulses.   No murmur heard. Pulmonary/Chest: Effort normal and breath sounds normal. No respiratory distress. She has no wheezes. She has no rhonchi.  Neurological: She is alert and oriented to person, place, and time.  Skin: Skin is warm and dry. No rash noted.  Psychiatric: She has a normal mood and affect. Her behavior is normal.  Vitals reviewed.   Filed Vitals:   03/19/15 1017  BP: 120/80  Pulse: 96  Temp: 98.3 F (36.8 C)  TempSrc: Oral  Resp: 16  Height: _0  (1.753 m)  Weight: 206 lb (93.441 kg)  SpO2: 98%       Assessment & Plan:   BEVELYN ARRIOLA is a 40 y.o. female Chronic sinusitis, unspecified location - Plan: Ambulatory referral to ENT Vertigo - Plan: Ambulatory referral to ENT Acute upper respiratory infection  - chronic sinusitis likely with recent viral URI likely.  Discussed need to follow up as previously planned with ENT. New referral placed. Symptomatic care and rtc precautions given for current symptoms. May also benefit from repeat allergist eval - she can call to schedule.   History of anaphylaxis - Plan: EPINEPHrine 0.15 MG/0.15ML IJ injection  - epipen #2 given with use instructions and need for ER/911 eval if used for acute allergic rxn.   Insomnia - Plan: clonazePAM (KLONOPIN) 1 MG tablet Panic attacks - Plan: clonazePAM (KLONOPIN) 1 MG tablet  -phone number provided  for psychiatry as she did not call them for follow up as planned at last ov. Discussed need for psychitry follow  up.  Short term Rx for Klonopin provided. [facility] controlled substance policy reviewed.   History of migraine headaches  - Reglan Rx provided if needed. Follow up with PCP to discuss migraine frequency further.  Meds ordered this encounter  Medications  . EPINEPHrine 0.15 MG/0.15ML IJ injection    Sig: Inject 0.15 mLs (0.15 mg total) into the muscle as needed for anaphylaxis.    Dispense:  2 Device    Refill:  0  . metoCLOPramide (REGLAN) 10 MG tablet    Sig: Take 1 tablet (10 mg total) by mouth every 6 (six) hours as needed for nausea or vomiting.    Dispense:  30 tablet    Refill:  0  . clonazePAM (KLONOPIN) 1 MG tablet    Sig: TAKE 1/2 TO 1 TABLET BY MOUTH DAILY AS NEEDED FOR ANXIETY OR SLEEP    Dispense:  30 tablet    Refill:  1   Patient Instructions   IF you received an x-Jennings today, you will receive an invoice from [facility]. Please contact [facility] at [phone] with questions or concerns regarding your invoice.   IF you received labwork today, you will receive an invoice from [employer]. Please contact [facility] at [phone] with questions or concerns regarding your invoice.   Our billing staff will not be able to assist you with questions regarding bills from these companies.  You will be contacted with the lab results as soon as they are available. The fastest way to get your results is to activate your My Chart account. Instructions are located on the last page of this paperwork. If you have not heard from [place] regarding the results in 2 weeks, please contact this office.  For your sinus congestion and vertigo - restart flonase nasal spray - 2 sprays per nostril each day.  I would recommend taking every day until seeing ENT, and may benefit form seeing allergist as well.   Epipen prescription given in allergic  reaction - as discussed - would need to seek are in ER or call 911 if needing to use this.   I refilled the reglan for migraines if needed, but follow up with primary provider to discuss these further. Return to the clinic or go to the nearest emergency room if any of your symptoms worsen or new symptoms occur.  You likely have a viral infection, and possible influenza, but based on the timing of your symptoms, no other testing is needed at this time. As these are viral infections - antibiotics are not indicated. Saline nasal spray atleast 4 times per day, over the counter mucinex or mucinex DM, drink plenty of fluids.  If you do continue to have a fever (you don not have one in the office), or any worsening of your symptoms in the next few days, return for recheck and possible blood work. Most of these viruses should start to improve within a week to 10 days. See more information below on upper respiratory infections.  Return to the clinic or go to the nearest emergency room if any of your symptoms worsen or new symptoms occur.   Call [facility] to schedule appointment as planned previously.  [address]   [place] [address]   [phone]     [facility] Policy for Prescribing Controlled Substances (Revised 11/2011) 1. Prescriptions for controlled substances will be filled by ONE provider at [facility] with whom you have established and  developed a plan for your care, including follow-up. 2. You are encouraged to schedule an appointment with your prescriber at our appointment center for follow-up visits whenever possible. 3. If you request a prescription for the controlled substance while at [facility] for an acute problem (with someone other than your regular prescriber), you MAY be given a ONE-TIME prescription for a 30-day supply of the controlled substance, to allow time for you to return to see your regular prescriber for additional  prescriptions.  Upper Respiratory Infection, Adult Most upper respiratory infections (URIs) are a viral infection of the air passages leading to the lungs. A URI affects the nose, throat, and upper air passages. The most common type of URI is nasopharyngitis and is typically referred to as "the common cold." URIs run their course and usually go away on their own. Most of the time, a URI does not require medical attention, but sometimes a bacterial infection in the upper airways can follow a viral infection. This is called a secondary infection. Sinus and middle ear infections are common types of secondary upper respiratory infections. Bacterial pneumonia can also complicate a URI. A URI can worsen asthma and chronic obstructive pulmonary disease (COPD). Sometimes, these complications can require emergency medical care and may be life threatening.  CAUSES Almost all URIs are caused by viruses. A virus is a type of germ and can spread from one person to another.  RISKS FACTORS You may be at risk for a URI if:   You smoke.   You have chronic heart or lung disease.  You have a weakened defense (immune) system.   You are very young or very old.   You have nasal allergies or asthma.  You work in crowded or poorly ventilated areas.  You work in health care facilities or schools. SIGNS AND SYMPTOMS  Symptoms typically develop 2-3 days after you come in contact with a cold virus. Most viral URIs last 7-10 days. However, viral URIs from the influenza virus (flu virus) can last 14-18 days and are typically more severe. Symptoms may include:   Runny or stuffy (congested) nose.   Sneezing.   Cough.   Sore throat.   Headache.   Fatigue.   Fever.   Loss of appetite.   Pain in your forehead, behind your eyes, and over your cheekbones (sinus pain).  Muscle aches.  DIAGNOSIS  Your health care provider may diagnose a URI by:  Physical exam.  Tests to check that your  symptoms are not due to another condition such as:  Strep throat.  Sinusitis.  Pneumonia.  Asthma. TREATMENT  A URI goes away on its own with time. It cannot be cured with medicines, but medicines may be prescribed or recommended to relieve symptoms. Medicines may help:  Reduce your fever.  Reduce your cough.  Relieve nasal congestion. HOME CARE INSTRUCTIONS   Take medicines only as directed by your health care provider.   Gargle warm saltwater or take cough drops to comfort your throat as directed by your health care provider.  Use a warm mist humidifier or inhale steam from a shower to increase air moisture. This may make it easier to breathe.  Drink enough fluid to keep your urine clear or pale yellow.   Eat soups and other clear broths and maintain good nutrition.   Rest as needed.   Return to work when your temperature has returned to normal or as your health care provider advises. You may need to stay home longer to  avoid infecting others. You can also use a face mask and careful hand washing to prevent spread of the virus.  Increase the usage of your inhaler if you have asthma.   Do not use any tobacco products, including cigarettes, chewing tobacco, or electronic cigarettes. If you need help quitting, ask your health care provider. PREVENTION  The best way to protect yourself from getting a cold is to practice good hygiene.   Avoid oral or hand contact with people with cold symptoms.   Wash your hands often if contact occurs.  There is no clear evidence that vitamin C, vitamin E, echinacea, or exercise reduces the chance of developing a cold. However, it is always recommended to get plenty of rest, exercise, and practice good nutrition.  SEEK MEDICAL CARE IF:   You are getting worse rather than better.   Your symptoms are not controlled by medicine.   You have chills.  You have worsening shortness of breath.  You have brown or red mucus.  You  have yellow or brown nasal discharge.  You have pain in your face, especially when you bend forward.  You have a fever.  You have swollen neck glands.  You have pain while swallowing.  You have white areas in the back of your throat. SEEK IMMEDIATE MEDICAL CARE IF:   You have severe or persistent:  Headache.  Ear pain.  Sinus pain.  Chest pain.  You have chronic lung disease and any of the following:  Wheezing.  Prolonged cough.  Coughing up blood.  A change in your usual mucus.  You have a stiff neck.  You have changes in your:  Vision.  Hearing.  Thinking.  Mood. MAKE SURE YOU:   Understand these instructions.  Will watch your condition.  Will get help right away if you are not doing well or get worse.   This information is not intended to replace advice given to you by your health care provider. Make sure you discuss any questions you have with your health care provider.   Document Released: 06/17/2000 Document Revised: 05/08/2014 Document Reviewed: 03/29/2013 Elsevier Interactive Patient Education [employer].       I personally performed the services described in this documentation, which was scribed in my presence. The recorded information has been reviewed and considered, and addended by me as needed.

## 2015-03-19 NOTE — Patient Instructions (Addendum)
IF you received an x-ray today, you will receive an invoice from [facility]. Please contact [facility] at [phone] with questions or concerns regarding your invoice.   IF you received labwork today, you will receive an invoice from [facility]. Please contact [facility] at [phone] with questions or concerns regarding your invoice.   Our billing staff will not be able to assist you with questions regarding bills from these companies.  You will be contacted with the lab results as soon as they are available. The fastest way to get your results is to activate your My Chart account. Instructions are located on the last page of this paperwork. If you have not heard from us regarding the results in 2 weeks, please contact this office.  For your sinus congestion and vertigo - restart flonase nasal spray - 2 sprays per nostril each day.  I would recommend taking every day until seeing ENT, and may benefit form seeing allergist as well.   Epipen prescription given in allergic reaction - as discussed - would need to seek are in ER or call 911 if needing to use this.   I refilled the reglan for migraines if needed, but follow up with primary provider to discuss these further. Return to the clinic or go to the nearest emergency room if any of your symptoms worsen or new symptoms occur.  You likely have a viral infection, and possible influenza, but based on the timing of your symptoms, no other testing is needed at this time. As these are viral infections - antibiotics are not indicated. Saline nasal spray atleast 4 times per day, over the counter mucinex or mucinex DM, drink plenty of fluids.  If you do continue to have a fever (you don not have one in the office), or any worsening of your symptoms in the next few days, return for recheck and possible blood work. Most of these viruses should start to improve within a week to 10 days. See more information below  on upper respiratory infections.  Return to the clinic or go to the nearest emergency room if any of your symptoms worsen or new symptoms occur.   Call [facility] to schedule appointment as planned previously.  [facility]    [address]   [place] [id number]   [phone]     [facility] Policy for Prescribing Controlled Substances (Revised 11/2011) 1. Prescriptions for controlled substances will be filled by ONE provider at [facility] with whom you have established and developed a plan for your care, including follow-up. 2. You are encouraged to schedule an appointment with your prescriber at our appointment center for follow-up visits whenever possible. 3. If you request a prescription for the controlled substance while at [facility] for an acute problem (with someone other than your regular prescriber), you MAY be given a ONE-TIME prescription for a 30-day supply of the controlled substance, to allow time for you to return to see your regular prescriber for additional prescriptions.  Upper Respiratory Infection, Adult Most upper respiratory infections (URIs) are a viral infection of the air passages leading to the lungs. A URI affects the nose, throat, and upper air passages. The most common type of URI is nasopharyngitis and is typically referred to as "the common cold." URIs run their course and usually go away on their own. Most of the time, a URI does not require medical attention, but sometimes a bacterial infection in the upper airways can follow a  viral infection. This is called a secondary infection. Sinus and middle ear infections are common types of secondary upper respiratory infections. Bacterial pneumonia can also complicate a URI. A URI can worsen asthma and chronic obstructive pulmonary disease (COPD). Sometimes, these complications can require emergency medical care and may be life threatening.  CAUSES Almost all URIs are caused by viruses. A  virus is a type of germ and can spread from one person to another.  RISKS FACTORS You may be at risk for a URI if:   You smoke.   You have chronic heart or lung disease.  You have a weakened defense (immune) system.   You are very young or very old.   You have nasal allergies or asthma.  You work in crowded or poorly ventilated areas.  You work in health care facilities or schools. SIGNS AND SYMPTOMS  Symptoms typically develop 2-3 days after you come in contact with a cold virus. Most viral URIs last 7-10 days. However, viral URIs from the influenza virus (flu virus) can last 14-18 days and are typically more severe. Symptoms may include:   Runny or stuffy (congested) nose.   Sneezing.   Cough.   Sore throat.   Headache.   Fatigue.   Fever.   Loss of appetite.   Pain in your forehead, behind your eyes, and over your cheekbones (sinus pain).  Muscle aches.  DIAGNOSIS  Your health care provider may diagnose a URI by:  Physical exam.  Tests to check that your symptoms are not due to another condition such as:  Strep throat.  Sinusitis.  Pneumonia.  Asthma. TREATMENT  A URI goes away on its own with time. It cannot be cured with medicines, but medicines may be prescribed or recommended to relieve symptoms. Medicines may help:  Reduce your fever.  Reduce your cough.  Relieve nasal congestion. HOME CARE INSTRUCTIONS   Take medicines only as directed by your health care provider.   Gargle warm saltwater or take cough drops to comfort your throat as directed by your health care provider.  Use a warm mist humidifier or inhale steam from a shower to increase air moisture. This may make it easier to breathe.  Drink enough fluid to keep your urine clear or pale yellow.   Eat soups and other clear broths and maintain good nutrition.   Rest as needed.   Return to work when your temperature has returned to normal or as your health care  provider advises. You may need to stay home longer to avoid infecting others. You can also use a face mask and careful hand washing to prevent spread of the virus.  Increase the usage of your inhaler if you have asthma.   Do not use any tobacco products, including cigarettes, chewing tobacco, or electronic cigarettes. If you need help quitting, ask your health care provider. PREVENTION  The best way to protect yourself from getting a cold is to practice good hygiene.   Avoid oral or hand contact with people with cold symptoms.   Wash your hands often if contact occurs.  There is no clear evidence that vitamin C, vitamin E, echinacea, or exercise reduces the chance of developing a cold. However, it is always recommended to get plenty of rest, exercise, and practice good nutrition.  SEEK MEDICAL CARE IF:   You are getting worse rather than better.   Your symptoms are not controlled by medicine.   You have chills.  You have worsening shortness of breath.  You have brown or red mucus.  You have yellow or brown nasal discharge.  You have pain in your face, especially when you bend forward.  You have a fever.  You have swollen neck glands.  You have pain while swallowing.  You have white areas in the back of your throat. SEEK IMMEDIATE MEDICAL CARE IF:   You have severe or persistent:  Headache.  Ear pain.  Sinus pain.  Chest pain.  You have chronic lung disease and any of the following:  Wheezing.  Prolonged cough.  Coughing up blood.  A change in your usual mucus.  You have a stiff neck.  You have changes in your:  Vision.  Hearing.  Thinking.  Mood. MAKE SURE YOU:   Understand these instructions.  Will watch your condition.  Will get help right away if you are not doing well or get worse.   This information is not intended to replace advice given to you by your health care provider. Make sure you discuss any questions you have with your  health care provider.   Document Released: 06/17/2000 Document Revised: 05/08/2014 Document Reviewed: 03/29/2013 Elsevier Interactive Patient Education [employer]

## 2015-05-10 ENCOUNTER — Telehealth: Payer: 59 | Admitting: Family

## 2015-05-10 DIAGNOSIS — M545 Low back pain: Secondary | ICD-10-CM

## 2015-05-10 MED ORDER — BACLOFEN 10 MG PO TABS: 10.0000 mg | ORAL_TABLET | Freq: Three times a day (TID) | ORAL | Status: DC | PRN

## 2015-05-10 MED ORDER — ETODOLAC 300 MG PO CAPS: 300.0000 mg | ORAL_CAPSULE | Freq: Two times a day (BID) | ORAL | Status: DC

## 2015-05-10 NOTE — Progress Notes (Signed)

## 2015-06-06 ENCOUNTER — Other Ambulatory Visit: Payer: Self-pay | Admitting: Family Medicine

## 2015-06-07 NOTE — Telephone Encounter (Signed)
Has she not followed up with psychiatry as planned last visit?  Plan was to establish care with psychiatrist regarding this medicine - see last ov. Let me know status.

## 2015-06-08 NOTE — Telephone Encounter (Signed)
Did patient follow up with psychiatry  See previous note.

## 2015-06-10 NOTE — Telephone Encounter (Signed)
PLease see prior notes - I need to know if she followed up with psychiatry as planned.  This was discussed in July 2016 with Dr. Merla Richesoolittle and in March visit with me.

## 2015-06-11 NOTE — Telephone Encounter (Signed)
LMOM for pt to CB to ask pt ? From Dr Neva SeatGreene below.

## 2015-06-12 NOTE — Telephone Encounter (Signed)
Pt called back and reported that she had not requested a RF, she is fine with the RFs Dr Neva SeatGreene had provided. She thinks that her pharm has her on auto RF and sent it w/out her req. Pt advised she had told Dr Neva SeatGreene that she didn't feel that she needed to see a psychiatrist and is aware that he was not going to continue Rxing clonazepam w/out her seeing specialist. She is in the process of finding a PCP now (she had tried to set up appt here, but with our not taking new pt's now, she was not able to).

## 2015-09-10 ENCOUNTER — Telehealth: Payer: 59 | Admitting: Family

## 2015-09-10 DIAGNOSIS — S39012A Strain of muscle, fascia and tendon of lower back, initial encounter: Secondary | ICD-10-CM

## 2015-09-10 MED ORDER — CYCLOBENZAPRINE HCL 10 MG PO TABS: 10.0000 mg | ORAL_TABLET | Freq: Three times a day (TID) | ORAL | 0 refills | Status: DC | PRN

## 2015-09-10 MED ORDER — NAPROXEN 500 MG PO TABS: 500.0000 mg | ORAL_TABLET | Freq: Two times a day (BID) | ORAL | 0 refills | Status: DC

## 2015-09-10 NOTE — Progress Notes (Signed)

## 2015-10-26 ENCOUNTER — Telehealth: Payer: 59 | Admitting: Family

## 2015-10-26 DIAGNOSIS — R112 Nausea with vomiting, unspecified: Secondary | ICD-10-CM

## 2015-10-26 MED ORDER — ONDANSETRON 4 MG PO TBDP: 4.0000 mg | ORAL_TABLET | Freq: Three times a day (TID) | ORAL | 0 refills | Status: DC | PRN

## 2015-10-26 NOTE — Progress Notes (Signed)

## 2015-11-19 ENCOUNTER — Telehealth: Payer: 59 | Admitting: Family

## 2015-11-19 DIAGNOSIS — S39012A Strain of muscle, fascia and tendon of lower back, initial encounter: Secondary | ICD-10-CM

## 2015-11-19 NOTE — Progress Notes (Signed)
Based on what you shared with me it looks like you have a serious condition that should be evaluated in a face to face office visit.  You have been treated for this previously through e-visit and symptoms have persist.   If you are having a true medical emergency please call 911.  If you need an urgent face to face visit, [facility] has four urgent care centers for your convenience.  If you need care fast and have a high deductible or no insurance consider:   [email or web address]  [phone]  [address], [address] [place], [place] [address] 8 am to 8 pm Monday-Friday 10 am to 4 pm Saturday-Sunday   The following sites will take your  insurance:    . [facility]  [phone] Get Driving Directions Find a Provider at this Location  [address] [place], [place] [address] . 10 am to 8 pm Monday-Friday . 12 pm to 8 pm Saturday-Sunday   . [facility]  [phone] Get Driving Directions Find a Provider at this Location  [address] [place], [place] [address] . 8 am to 8 pm Monday-Friday . 9 am to 6 pm Saturday . 11 am to 6 pm Sunday   . [facility] [place]  [phone] Get Driving Directions  [address] [place], [place] [address] . 8 am to 8 pm Monday-Friday . 8 am to 4 pm Saturday-Sunday   . [facility] (walk-ins welcome, or call for a scheduled time)  [phone]  Get Driving Directions Find a Provider at this Location  [address] [place], [place] [address] . 8 am to 8:30 pm Monday-Thursday . 8 am to 6 pm Friday . 8 am to 4 pm Saturday-Sunday   Your e-visit answers were reviewed by a board certified advanced clinical practitioner to complete your personal care plan.  Thank you for using e-Visits.

## 2015-12-07 ENCOUNTER — Emergency Department (HOSPITAL_BASED_OUTPATIENT_CLINIC_OR_DEPARTMENT_OTHER)
Admission: EM | Admit: 2015-12-07 | Discharge: 2015-12-07 | Disposition: A | Discharge status: Home / Self Care | Payer: 59 | Attending: Emergency Medicine | Admitting: Emergency Medicine

## 2015-12-07 ENCOUNTER — Encounter (HOSPITAL_BASED_OUTPATIENT_CLINIC_OR_DEPARTMENT_OTHER): Payer: Self-pay

## 2015-12-07 DIAGNOSIS — Z79899 Other long term (current) drug therapy: Secondary | ICD-10-CM | POA: Insufficient documentation

## 2015-12-07 DIAGNOSIS — G43909 Migraine, unspecified, not intractable, without status migrainosus: Secondary | ICD-10-CM | POA: Diagnosis present

## 2015-12-07 DIAGNOSIS — G43409 Hemiplegic migraine, not intractable, without status migrainosus: Secondary | ICD-10-CM | POA: Insufficient documentation

## 2015-12-07 DIAGNOSIS — F410 Panic disorder [episodic paroxysmal anxiety] without agoraphobia: Secondary | ICD-10-CM

## 2015-12-07 HISTORY — DX: Panic disorder (episodic paroxysmal anxiety): F41.0

## 2015-12-07 LAB — PREGNANCY, URINE: PREG TEST UR: NEGATIVE

## 2015-12-07 MED ORDER — SODIUM CHLORIDE 0.9 % IV BOLUS (SEPSIS)
1000.0000 mL | Freq: Once | INTRAVENOUS | Status: AC
  Administered 2015-12-07: 1000 mL via INTRAVENOUS

## 2015-12-07 MED ORDER — METOCLOPRAMIDE HCL 5 MG/ML IJ SOLN
10.0000 mg | Freq: Once | INTRAMUSCULAR | Status: AC
  Administered 2015-12-07: 10 mg via INTRAVENOUS

## 2015-12-07 MED ORDER — KETOROLAC TROMETHAMINE 15 MG/ML IJ SOLN
15.0000 mg | Freq: Once | INTRAMUSCULAR | Status: AC
  Administered 2015-12-07: 15 mg via INTRAVENOUS

## 2015-12-07 MED ORDER — DIPHENHYDRAMINE HCL 50 MG/ML IJ SOLN
25.0000 mg | Freq: Once | INTRAMUSCULAR | Status: AC
  Administered 2015-12-07: 25 mg via INTRAVENOUS

## 2015-12-07 NOTE — ED Triage Notes (Signed)
Pt c/o migraine headache and panic attacks today; states had numbness and tingling after hyperventilating. Hx of migraine x6763yrs, takes OTC meds but hasn't helped today

## 2015-12-07 NOTE — ED Provider Notes (Signed)
[facility] Provider Note: Jasmine DellJ. Lane Azari Janssens, MD, FACEP  CSN: [id number] MRN: [id number] ARRIVAL: 12/07/15 at 0243 ROOM: MH05/MH05   CHIEF COMPLAINT  Headache   HISTORY OF PRESENT ILLNESS  Jasmine Jennings is a 40 y.o. female with a history of migraines and panic attacks. She is here with a migraine that began yesterday morning. As is typical of her migraines the pain began in her left trapezoid region, was followed by numbness of the right side of face, with pain finally settling in her forehead bilaterally. Her pain was an 8 out of 10 earlier and she rates it a 6 out of 10 now. She describes it as a dull pain consistent with previous migraines. There is associated nausea and photophobia. She has taken Excedrin without relief.  She also had an exacerbation of her panic attacks earlier with hyperventilation, paresthesias and difficulty concentrating.    Past Medical History:  Diagnosis Date  . Allergy   . Migraines   . MRSA cellulitis of left foot    L foot  . Panic attacks   . Vertigo     Past Surgical History:  Procedure Laterality Date  . cryo    . LEEP      Family History  Problem Relation Age of Onset  . Hyperlipidemia Mother   . Hypertension Mother   . Diabetes Other     Social History  Substance Use Topics  . Smoking status: Never Smoker  . Smokeless tobacco: Never Used  . Alcohol use No    Prior to Admission medications   Medication Sig Start Date End Date Taking? Authorizing Provider  Butalbital-APAP-Caffeine 50-300-40 MG CAPS TAKE 1 CAPSULE EVERY 4 HOURS AS NEEDED FOR MIGRAINE. 12/10/14   Historical Provider, MD  Clobetasol Propionate 0.05 % lotion Apply 1 application topically 2 (two) times daily. Use sparingly until rash gone plus one additional day, maximum 10 days. 03/22/14   Emi Belfasteborah B Gessner, FNP  clonazePAM (KLONOPIN) 1 MG tablet TAKE 1/2 TO 1 TABLET BY MOUTH DAILY AS NEEDED FOR ANXIETY OR SLEEP 03/19/15   Shade FloodJeffrey R Greene, MD    cyclobenzaprine (FLEXERIL) 10 MG tablet Take 1 tablet (10 mg total) by mouth 3 (three) times daily as needed for muscle spasms. 09/10/15   Eulis FosterPadonda B Webb, FNP  diazepam (VALIUM) 5 MG tablet Take 1 tablet (5 mg total) by mouth every 8 (eight) hours as needed (vertigo). 07/13/14   Loren Raceravid Yelverton, MD  EPINEPHrine 0.15 MG/0.15ML IJ injection Inject 0.15 mLs (0.15 mg total) into the muscle as needed for anaphylaxis. 03/19/15   Shade FloodJeffrey R Greene, MD  fluticasone (FLONASE) 50 MCG/ACT nasal spray Place 2 sprays into both nostrils daily. 06/08/14   Shon Batonourtney F Horton, MD  mirtazapine (REMERON) 30 MG tablet Take 1 tablet (30 mg total) by mouth at bedtime. 08/04/14   Tonye Pearsonobert P Doolittle, MD  ondansetron (ZOFRAN ODT) 4 MG disintegrating tablet Take 1 tablet (4 mg total) by mouth every 8 (eight) hours as needed for nausea or vomiting. 10/26/15   Beau FannyJohn C Withrow, FNP  PE-Diphenhydramine-DM-GG-APAP [facility](MUCINEX CHILD MS DAY-NIGHT CLD PO) Take 2 tablets by mouth 2 (two) times daily.    Historical Provider, MD  zolpidem (AMBIEN CR) 12.5 MG CR tablet Take 12.5 mg by mouth at bedtime. 12/25/14   Historical Provider, MD  zolpidem (AMBIEN) 10 MG tablet Take 1 tablet by mouth at bedtime. 05/22/14   Historical Provider, MD    Allergies Amoxicillin; Doxycycline; Latex; Shellfish allergy; Yeast-related products; and Ciprofloxacin  REVIEW OF SYSTEMS  Negative except as noted here or in the History of Present Illness.   PHYSICAL EXAMINATION  Initial Vital Signs Blood pressure 143/82, pulse 100, temperature 98.1 F (36.7 C), temperature source Oral, resp. rate 18, height 5\' 9"  (1.753 m), weight 206 lb (93.4 kg), last menstrual period 11/30/2015, SpO2 95 %.  Examination General: Well-developed, well-nourished female in no acute distress; appearance consistent with age of record HENT: normocephalic; atraumatic Eyes: pupils equal, round and reactive to light; extraocular muscles intact; photophobia Neck: supple Heart: regular rate  and rhythm Lungs: clear to auscultation bilaterally Abdomen: soft; nondistended; nontender; bowel sounds present Extremities: No deformity; full range of motion; pulses normal Neurologic: Awake, alert and oriented; motor function intact in all extremities and symmetric; no facial droop Skin: Warm and dry Psychiatric: Normal mood and affect   RESULTS  Summary of this visit's results, reviewed by myself:   EKG Interpretation  Date/Time:    Ventricular Rate:    PR Interval:    QRS Duration:   QT Interval:    QTC Calculation:   R Axis:     Text Interpretation:        Laboratory Studies: Results for orders placed or performed during the hospital encounter of 12/07/15 (from the past 24 hour(s))  Pregnancy, urine     Status: None   Collection Time: 12/07/15  4:13 AM  Result Value Ref Range   Preg Test, Ur NEGATIVE NEGATIVE   Imaging Studies: No results found.  ED COURSE  Nursing notes and initial vitals signs, including pulse oximetry, reviewed.  Vitals:   12/07/15 0245 12/07/15 0248  BP: 143/82   Pulse: 100   Resp: 18   Temp: 98.1 F (36.7 C)   TempSrc: Oral   SpO2: 95%   Weight:  206 lb (93.4 kg)  Height:  5\' 9"  (1.753 m)   5:45 AM Patient feeling much better after IV fluids and medications.  PROCEDURES    ED DIAGNOSES     ICD-9-CM ICD-10-CM   1. 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 Panic attack 300.01 F41.0        Paula LibraJohn Kasen Sako, MD 12/07/15 [phone]

## 2016-04-14 ENCOUNTER — Telehealth: Payer: 59 | Admitting: Nurse Practitioner

## 2016-04-14 DIAGNOSIS — K120 Recurrent oral aphthae: Secondary | ICD-10-CM

## 2016-04-14 MED ORDER — CHLORHEXIDINE GLUCONATE 0.12% ORAL RINSE (MEDLINE KIT): 15.0000 mL | Freq: Two times a day (BID) | OROMUCOSAL | 0 refills | Status: DC

## 2016-04-14 NOTE — Progress Notes (Signed)
We are sorry that you are not feeling well.  Here is how we plan to help!    Based on what you have shared with me it does look like you have a viral infection.  These are canker sores in the mouth- they are not cold sores. These ar a common condition of the oral mucosa usually caused by a virus. They usually self resolve in 7-10 days. Treatment is oral anusol cream and antibacterial mouthwash. I have sent in rx for chlorhexidine oralpharyngeal mouth wash- swish and spit 15ml 2x a day.     HOME CARE:   Wash your hands frequently.  Do not pick at or rub the sore.  Don't open the blisters.  Avoid kissing other people during this time.  Avoid sharing drinking glasses, eating utensils, or razors.  Do not handle contact lenses unless you have thoroughly washed your hands with soap and warm water!  Avoid oral sex during this time.  Herpes from sores on your mouth can spread to your partner's genital area.  Avoid contact with anyone who has eczema or a weakened immune system.  Cold sores are often triggered by exposure to intense sunlight, use a lip balm containing a sunscreen (SPF 30 or higher).  GET HELP RIGHT AWAY IF:   Blisters look infected.  Blisters occur near or in the eye.  Symptoms last longer than 10 days.  Your symptoms become worse.  MAKE SURE YOU:   Understand these instructions.  Will watch your condition.  Will get help right away if you are not doing well or get worse.    Your e-visit answers were reviewed by a board certified advanced clinical practitioner to complete your personal care plan.  Depending upon the condition, your plan could have  Included both over the counter or prescription medications.    Please review your pharmacy choice.  Be sure that the pharmacy you have chosen is open so that you can pick up your prescription now.  If there is a problem you csn message your provider in MyChart to have the prescription routed to another pharmacy.     Your safety is important to [place].  If you have drug allergies check our prescription carefully.  For the next 24 hours you can use MyChart to ask questions about today's visit, request a non-urgent call back, or ask for a work or school excuse from your e-visit provider.  You will get an email in the next two days asking about your experience.  I hope that your e-visit has been valuable and will speed your recovery.

## 2016-05-30 ENCOUNTER — Telehealth: Payer: 59 | Admitting: Physician Assistant

## 2016-05-30 DIAGNOSIS — M546 Pain in thoracic spine: Secondary | ICD-10-CM

## 2016-05-30 MED ORDER — CYCLOBENZAPRINE HCL 10 MG PO TABS: 10.0000 mg | ORAL_TABLET | Freq: Three times a day (TID) | ORAL | 0 refills | Status: DC | PRN

## 2016-05-30 MED ORDER — NAPROXEN 500 MG PO TABS: 500.0000 mg | ORAL_TABLET | Freq: Two times a day (BID) | ORAL | 0 refills | Status: DC

## 2016-05-30 NOTE — Progress Notes (Signed)

## 2016-07-08 ENCOUNTER — Telehealth: Payer: 59 | Admitting: Physician Assistant

## 2016-07-08 DIAGNOSIS — R600 Localized edema: Secondary | ICD-10-CM

## 2016-07-08 NOTE — Progress Notes (Signed)
Based on what you shared with me it looks like you have a serious condition that should be evaluated in a face to face office visit. The lip swelling is concerning. You need assessment and potential a steroid injection to calm this down.    NOTE: Even if you have entered your credit card information for this eVisit, you will not be charged.   If you are having a true medical emergency please call 911.  If you need an urgent face to face visit, [facility] has four urgent care centers for your convenience.  If you need care fast and have a high deductible or no insurance consider:   [email or web address]  [phone]  [address] [place], [place] [address] 8 am to 8 pm Monday-Friday 10 am to 4 pm Saturday-Sunday   The following sites will take your  insurance:    . [facility]  [phone] Get Driving Directions Find a Provider at this Location  [address] [place], [place] [address] . 10 am to 8 pm Monday-Friday . 12 pm to 8 pm Saturday-Sunday   . [facility]  [phone] Get Driving Directions Find a Provider at this Location  [address] [place], [place] [address] . 8 am to 8 pm Monday-Friday . 9 am to 6 pm Saturday . 11 am to 6 pm Sunday   . [facility] [place]  [phone] Get Driving Directions  [address] [place], [place] [address] . 8 am to 8 pm Monday-Friday . 8 am to 4 pm Saturday-Sunday   Your e-visit answers were reviewed by a board certified advanced clinical practitioner to complete your personal care plan.  Thank you for using e-Visits.

## 2016-09-26 ENCOUNTER — Telehealth: Payer: 59 | Admitting: Physician Assistant

## 2016-09-26 DIAGNOSIS — M546 Pain in thoracic spine: Secondary | ICD-10-CM

## 2016-09-26 MED ORDER — BACLOFEN 10 MG PO TABS: 10.0000 mg | ORAL_TABLET | Freq: Three times a day (TID) | ORAL | 0 refills | Status: DC

## 2016-09-26 MED ORDER — NAPROXEN 500 MG PO TABS: 500.0000 mg | ORAL_TABLET | Freq: Two times a day (BID) | ORAL | 0 refills | Status: DC

## 2016-09-26 NOTE — Progress Notes (Signed)

## 2016-11-07 ENCOUNTER — Telehealth: Payer: 59 | Admitting: Physician Assistant

## 2016-11-07 DIAGNOSIS — J329 Chronic sinusitis, unspecified: Secondary | ICD-10-CM

## 2016-11-07 DIAGNOSIS — B9789 Other viral agents as the cause of diseases classified elsewhere: Secondary | ICD-10-CM

## 2016-11-07 MED ORDER — AZELASTINE HCL 0.1 % NA SOLN: 2.0000 | Freq: Two times a day (BID) | NASAL | 0 refills | Status: DC

## 2016-11-07 NOTE — Progress Notes (Signed)

## 2017-04-08 ENCOUNTER — Telehealth: Payer: 59 | Admitting: Physician Assistant

## 2017-04-08 DIAGNOSIS — M546 Pain in thoracic spine: Secondary | ICD-10-CM

## 2017-04-08 MED ORDER — CYCLOBENZAPRINE HCL 10 MG PO TABS: 10.0000 mg | ORAL_TABLET | Freq: Three times a day (TID) | ORAL | 0 refills | Status: DC | PRN

## 2017-04-08 MED ORDER — NAPROXEN 500 MG PO TABS: 500.0000 mg | ORAL_TABLET | Freq: Two times a day (BID) | ORAL | 0 refills | Status: DC

## 2017-04-08 NOTE — Progress Notes (Signed)

## 2017-04-20 ENCOUNTER — Telehealth: Payer: 59 | Admitting: Nurse Practitioner

## 2017-04-20 DIAGNOSIS — R110 Nausea: Secondary | ICD-10-CM

## 2017-04-20 DIAGNOSIS — R197 Diarrhea, unspecified: Secondary | ICD-10-CM

## 2017-04-20 MED ORDER — ONDANSETRON HCL 4 MG PO TABS: 4.0000 mg | ORAL_TABLET | Freq: Three times a day (TID) | ORAL | 0 refills | Status: DC | PRN

## 2017-04-20 NOTE — Progress Notes (Signed)
We are sorry that you are not feeling well.  Here is how we plan to help!  Based on what you have shared with me it looks like you have Acute Infectious Diarrhea.  Most cases of acute diarrhea are due to infections with virus and bacteria and are self-limited conditions lasting less than 14 days.  For your symptoms you may take Imodium 2 mg tablets that are over the counter at your local pharmacy. Take two tablet now and then one after each loose stool up to 6 a day.  Antibiotics are not needed for most people with diarrhea.  Optional: Zofran 4 mg 1 tablet every 8 hours as needed for nausea and vomiting    HOME CARE  We recommend changing your diet to help with your symptoms for the next few days.  Drink plenty of fluids that contain water salt and sugar. Sports drinks such as Gatorade may help.   You may try broths, soups, bananas, applesauce, soft breads, mashed potatoes or crackers.   You are considered infectious for as long as the diarrhea continues. Hand washing or use of alcohol based hand sanitizers is recommend.  It is best to stay out of work or school until your symptoms stop.   GET HELP RIGHT AWAY  If you have dark yellow colored urine or do not pass urine frequently you should drink more fluids.    If your symptoms worsen   If you feel like you are going to pass out (faint)  You have a new problem  MAKE SURE YOU   Understand these instructions.  Will watch your condition.  Will get help right away if you are not doing well or get worse.  Your e-visit answers were reviewed by a board certified advanced clinical practitioner to complete your personal care plan.  Depending on the condition, your plan could have included both over the counter or prescription medications.  If there is a problem please reply  once you have received a response from your provider.  Your safety is important to us.  If you have drug allergies check your prescription carefully.     You can use MyChart to ask questions about today's visit, request a non-urgent call back, or ask for a work or school excuse for 24 hours related to this e-Visit. If it has been greater than 24 hours you will need to follow up with your provider, or enter a new e-Visit to address those concerns.   You will get an e-mail in the next two days asking about your experience.  I hope that your e-visit has been valuable and will speed your recovery. Thank you for using e-visits.   

## 2017-05-05 ENCOUNTER — Other Ambulatory Visit: Payer: Self-pay | Admitting: Obstetrics and Gynecology

## 2017-05-05 DIAGNOSIS — R928 Other abnormal and inconclusive findings on diagnostic imaging of breast: Secondary | ICD-10-CM

## 2017-05-17 ENCOUNTER — Ambulatory Visit: Payer: 59

## 2017-05-17 ENCOUNTER — Ambulatory Visit
Admission: RE | Admit: 2017-05-17 | Discharge: 2017-05-17 | Disposition: A | Discharge status: Home / Self Care | Payer: Commercial Managed Care - PPO | Source: Ambulatory Visit | Attending: Obstetrics and Gynecology | Admitting: Obstetrics and Gynecology

## 2017-05-17 DIAGNOSIS — R928 Other abnormal and inconclusive findings on diagnostic imaging of breast: Secondary | ICD-10-CM

## 2017-08-04 ENCOUNTER — Telehealth: Payer: Commercial Managed Care - PPO | Admitting: Nurse Practitioner

## 2017-08-04 DIAGNOSIS — M546 Pain in thoracic spine: Secondary | ICD-10-CM

## 2017-08-04 DIAGNOSIS — M545 Low back pain, unspecified: Secondary | ICD-10-CM

## 2017-08-04 MED ORDER — CYCLOBENZAPRINE HCL 10 MG PO TABS: 10.0000 mg | ORAL_TABLET | Freq: Three times a day (TID) | ORAL | 0 refills | Status: DC | PRN

## 2017-08-04 MED ORDER — NAPROXEN 500 MG PO TABS: 500.0000 mg | ORAL_TABLET | Freq: Two times a day (BID) | ORAL | 0 refills | Status: DC

## 2017-08-04 NOTE — Progress Notes (Signed)

## 2017-11-29 ENCOUNTER — Telehealth: Payer: Commercial Managed Care - PPO | Admitting: Physician Assistant

## 2017-11-29 DIAGNOSIS — M546 Pain in thoracic spine: Secondary | ICD-10-CM

## 2017-11-29 MED ORDER — CYCLOBENZAPRINE HCL 10 MG PO TABS: 10.0000 mg | ORAL_TABLET | Freq: Three times a day (TID) | ORAL | 0 refills | Status: AC | PRN

## 2017-11-29 MED ORDER — NAPROXEN 500 MG PO TABS: 500.0000 mg | ORAL_TABLET | Freq: Two times a day (BID) | ORAL | 0 refills | Status: DC

## 2017-11-29 NOTE — Progress Notes (Signed)

## 2018-03-09 ENCOUNTER — Telehealth: Payer: Commercial Managed Care - PPO | Admitting: Family

## 2018-03-09 DIAGNOSIS — M545 Low back pain, unspecified: Secondary | ICD-10-CM

## 2018-03-09 MED ORDER — BACLOFEN 10 MG PO TABS: 10.0000 mg | ORAL_TABLET | Freq: Three times a day (TID) | ORAL | 0 refills | Status: DC

## 2018-03-09 MED ORDER — NAPROXEN 500 MG PO TABS
500.0000 mg | ORAL_TABLET | Freq: Two times a day (BID) | ORAL | 0 refills | Status: DC
Start: 2018-03-09 — End: 2019-11-20

## 2018-03-09 NOTE — Progress Notes (Signed)

## 2018-04-28 ENCOUNTER — Other Ambulatory Visit: Payer: Self-pay | Admitting: Orthopedic Surgery

## 2018-04-28 DIAGNOSIS — M542 Cervicalgia: Secondary | ICD-10-CM

## 2018-04-28 DIAGNOSIS — M5412 Radiculopathy, cervical region: Secondary | ICD-10-CM

## 2018-05-25 ENCOUNTER — Ambulatory Visit (HOSPITAL_COMMUNITY)
Admission: RE | Admit: 2018-05-25 | Discharge: 2018-05-25 | Disposition: A | Discharge status: Home / Self Care | Payer: Commercial Managed Care - PPO | Source: Ambulatory Visit | Attending: Orthopedic Surgery | Admitting: Orthopedic Surgery

## 2018-05-25 ENCOUNTER — Other Ambulatory Visit: Payer: Self-pay

## 2018-05-25 DIAGNOSIS — M5412 Radiculopathy, cervical region: Secondary | ICD-10-CM | POA: Insufficient documentation

## 2018-05-25 DIAGNOSIS — M542 Cervicalgia: Secondary | ICD-10-CM | POA: Diagnosis present

## 2018-06-07 ENCOUNTER — Other Ambulatory Visit (HOSPITAL_COMMUNITY): Payer: Self-pay | Admitting: Orthopedic Surgery

## 2018-06-07 DIAGNOSIS — M25512 Pain in left shoulder: Secondary | ICD-10-CM

## 2018-06-15 ENCOUNTER — Ambulatory Visit (HOSPITAL_COMMUNITY): Payer: Commercial Managed Care - PPO

## 2018-06-17 ENCOUNTER — Ambulatory Visit (HOSPITAL_COMMUNITY)
Admission: RE | Admit: 2018-06-17 | Discharge: 2018-06-17 | Disposition: A | Discharge status: Home / Self Care | Payer: Commercial Managed Care - PPO | Source: Ambulatory Visit | Attending: Orthopedic Surgery | Admitting: Orthopedic Surgery

## 2018-06-17 ENCOUNTER — Other Ambulatory Visit: Payer: Self-pay

## 2018-06-17 DIAGNOSIS — M25512 Pain in left shoulder: Secondary | ICD-10-CM | POA: Insufficient documentation

## 2019-03-21 NOTE — Progress Notes (Deleted)
NEUROLOGY CONSULTATION NOTE  Jasmine Jennings MRN: [id number] DOB: 1975/04/01  Referring provider: Marda Stalker, PA-C Primary care provider: Marda Stalker, PA-C  Reason for consult:  migraines  HISTORY OF PRESENT ILLNESS: Jasmine Jennings is a 44 year old female with anxiety who presents for migraines.  History supplemented by referring provider note.  Onset:  *** Location:  *** Quality:  *** Intensity:  ***.  *** denies new headache, thunderclap headache or severe headache that wakes *** from sleep. Aura:  *** Premonitory Phase:  *** Postdrome:  *** Associated symptoms:  Nausea, photophobia.  *** denies associated unilateral numbness or weakness. Duration:  *** Frequency:  *** Frequency of abortive medication: *** Triggers:  Prolonged periods on the computer; anxiety/emotional stress Exacerbating factors:  *** Relieving factors:  *** Activity:  ***  MRI of brain without contrast from 01/18/2012 to evaluate headaches was personally reviewed and was normal.  Current NSAIDS:  ibuprofen Current analgesics:  Fioricet; Excedrin Migraine; hydrocodone-acetaminophen (***) Current triptans:  *** Current ergotamine:  *** Current anti-emetic:  *** Current muscle relaxants:  *** Current anti-anxiolytic:  clonazepam Current sleep aide:  Ambien 12.30m; melatonin Current Antihypertensive medications:  *** Current Antidepressant medications:  Lexapro 262mdaily Current Anticonvulsant medications:  Gabapentin 30031mo 600m44mily Current anti-CGRP:  *** Current Vitamins/Herbal/Supplements:  Melatonin *** Current Antihistamines/Decongestants:  Flonase Other therapy:  *** Hormone/birth control:  *** Other medications:  ***  Past NSAIDS:  Naproxen 500mg27mt analgesics:  tramadol Past abortive triptans:  Relpax 40mg;22matriptan 100mg P35mabortive ergotamine:  *** Past muscle relaxants:  Flexeril; baclofen 10mg; S61mxin Past anti-emetic:  Zofran ODT 4mg; Reg54m 10mg;  pr51mhazine 25mg Past 83mhypertensive medications:  Propranolol 10mg twice 44my Past antidepressant medications:  Zoloft (stomach upset); mirtazapine Past anticonvulsant medications:  *** Past anti-CGRP:  *** Past vitamins/Herbal/Supplements:  *** Past antihistamines/decongestants:  meclizine Other past therapies:  ***  Caffeine:  *** Alcohol:  *** Smoker:  *** Diet:  *** Exercise:  *** Depression:  ***; Anxiety:  *** Other pain:  *** Sleep hygiene:  Insomnia.  Diagnosed with circadian rhythm disorder Family history of headache:  ***  PAST MEDICAL HISTORY: Past Medical History:  Diagnosis Date  . Allergy   . Migraines   . MRSA cellulitis of left foot    L foot  . Panic attacks   . Vertigo     PAST SURGICAL HISTORY: Past Surgical History:  Procedure Laterality Date  . cryo    . LEEP      MEDICATIONS: Current Outpatient Medications on File Prior to Visit  Medication Sig Dispense Refill  . azelastine (ASTELIN) 0.1 % nasal spray Place 2 sprays into both nostrils 2 (two) times daily. Use in each nostril as directed 30 mL 0  . baclofen (LIORESAL) 10 MG tablet Take 1 tablet (10 mg total) by mouth 3 (three) times daily. 30 each 0  . Butalbital-APAP-Caffeine 50-300-40 MG CAPS TAKE 1 CAPSULE EVERY 4 HOURS AS NEEDED FOR MIGRAINE.  0  . chlorhexidine gluconate, MEDLINE KIT, (PERIDEX) 0.12 % solution Use as directed 15 mLs in the mouth or throat 2 (two) times daily. 120 mL 0  . Clobetasol Propionate 0.05 % lotion Apply 1 application topically 2 (two) times daily. Use sparingly until rash gone plus one additional day, maximum 10 days. 118 mL 0  . clonazePAM (KLONOPIN) 1 MG tablet TAKE 1/2 TO 1 TABLET BY MOUTH DAILY AS NEEDED FOR ANXIETY OR SLEEP 30 tablet 1  . cyclobenzaprine (FLEXERIL) 10 MG tablet Take  1 tablet (10 mg total) by mouth 3 (three) times daily as needed for muscle spasms. 30 tablet 0  . diazepam (VALIUM) 5 MG tablet Take 1 tablet (5 mg total) by mouth every 8 (eight)  hours as needed (vertigo). 10 tablet 0  . EPINEPHrine 0.15 MG/0.15ML IJ injection Inject 0.15 mLs (0.15 mg total) into the muscle as needed for anaphylaxis. 2 Device 0  . fluticasone (FLONASE) 50 MCG/ACT nasal spray Place 2 sprays into both nostrils daily. 16 g 0  . mirtazapine (REMERON) 30 MG tablet Take 1 tablet (30 mg total) by mouth at bedtime. 30 tablet 1  . naproxen (NAPROSYN) 500 MG tablet Take 1 tablet (500 mg total) by mouth 2 (two) times daily with a meal. 20 tablet 0  . naproxen (NAPROSYN) 500 MG tablet Take 1 tablet (500 mg total) by mouth 2 (two) times daily with a meal. 30 tablet 0  . ondansetron (ZOFRAN ODT) 4 MG disintegrating tablet Take 1 tablet (4 mg total) by mouth every 8 (eight) hours as needed for nausea or vomiting. 30 tablet 0  . ondansetron (ZOFRAN) 4 MG tablet Take 1 tablet (4 mg total) by mouth every 8 (eight) hours as needed for nausea or vomiting. 20 tablet 0  . PE-Diphenhydramine-DM-GG-APAP (MUCINEX CHILD MS DAY-NIGHT CLD PO) Take 2 tablets by mouth 2 (two) times daily.    Marland Kitchen zolpidem (AMBIEN CR) 12.5 MG CR tablet Take 12.5 mg by mouth at bedtime.  0  . zolpidem (AMBIEN) 10 MG tablet Take 1 tablet by mouth at bedtime.  0   No current facility-administered medications on file prior to visit.    ALLERGIES: Allergies  Allergen Reactions  . Amoxicillin Diarrhea and Nausea And Vomiting  . Doxycycline Other (See Comments)    Skin discoloration   . Latex   . Shellfish Allergy   . Yeast-Related Products   . Ciprofloxacin Rash    FAMILY HISTORY: Family History  Problem Relation Age of Onset  . Hyperlipidemia Mother   . Hypertension Mother   . Diabetes Other    ***.  SOCIAL HISTORY: Social History   Socioeconomic History  . Marital status: Single    Spouse name: Not on file  . Number of children: Not on file  . Years of education: Not on file  . Highest education level: Not on file  Occupational History  . Not on file  Tobacco Use  . Smoking  status: Never Smoker  . Smokeless tobacco: Never Used  Substance and Sexual Activity  . Alcohol use: No    Alcohol/week: 0.0 standard drinks  . Drug use: No  . Sexual activity: Never  Other Topics Concern  . Not on file  Social History Narrative  . Not on file   Social Determinants of Health   Financial Resource Strain:   . Difficulty of Paying Living Expenses:   Food Insecurity:   . Worried About Charity fundraiser in the Last Year:   . Arboriculturist in the Last Year:   Transportation Needs:   . Film/video editor (Medical):   Marland Kitchen Lack of Transportation (Non-Medical):   Physical Activity:   . Days of Exercise per Week:   . Minutes of Exercise per Session:   Stress:   . Feeling of Stress :   Social Connections:   . Frequency of Communication with Friends and Family:   . Frequency of Social Gatherings with Friends and Family:   . Attends Religious Services:   .  Active Member of Clubs or Organizations:   . Attends Archivist Meetings:   Marland Kitchen Marital Status:   Intimate Partner Violence:   . Fear of Current or Ex-Partner:   . Emotionally Abused:   Marland Kitchen Physically Abused:   . Sexually Abused:     REVIEW OF SYSTEMS: Constitutional: No fevers, chills, or sweats, no generalized fatigue, change in appetite Eyes: No visual changes, double vision, eye pain Ear, nose and throat: No hearing loss, ear pain, nasal congestion, sore throat Cardiovascular: No chest pain, palpitations Respiratory:  No shortness of breath at rest or with exertion, wheezes GastrointestinaI: No nausea, vomiting, diarrhea, abdominal pain, fecal incontinence Genitourinary:  No dysuria, urinary retention or frequency Musculoskeletal:  No neck pain, back pain Integumentary: No rash, pruritus, skin lesions Neurological: as above Psychiatric: No depression, insomnia, anxiety Endocrine: No palpitations, fatigue, diaphoresis, mood swings, change in appetite, change in weight, increased  thirst Hematologic/Lymphatic:  No purpura, petechiae. Allergic/Immunologic: no itchy/runny eyes, nasal congestion, recent allergic reactions, rashes  PHYSICAL EXAM: *** General: No acute distress.  Patient appears ***-groomed.  *** Head:  Normocephalic/atraumatic Eyes:  fundi examined but not visualized Neck: supple, no paraspinal tenderness, full range of motion Back: No paraspinal tenderness Heart: regular rate and rhythm Lungs: Clear to auscultation bilaterally. Vascular: No carotid bruits. Neurological Exam: Mental status: alert and oriented to person, place, and time, recent and remote memory intact, fund of knowledge intact, attention and concentration intact, speech fluent and not dysarthric, language intact. Cranial nerves: CN I: not tested CN II: pupils equal, round and reactive to light, visual fields intact CN III, IV, VI:  full range of motion, no nystagmus, no ptosis CN V: facial sensation intact CN VII: upper and lower face symmetric CN VIII: hearing intact CN IX, X: gag intact, uvula midline CN XI: sternocleidomastoid and trapezius muscles intact CN XII: tongue midline Bulk & Tone: normal, no fasciculations. Motor:  5/5 throughout *** Sensation:  Pinprick *** temperature *** and vibration sensation intact.  ***. Deep Tendon Reflexes:  2+ throughout, *** toes downgoing.  *** Finger to nose testing:  Without dysmetria.  *** Heel to shin:  Without dysmetria.  *** Gait:  Normal station and stride.  Able to turn and tandem walk. Romberg ***.  IMPRESSION: ***  PLAN: ***  Thank you for allowing me to take part in the care of this patient.  Metta Clines, DO  CC: ***

## 2019-03-23 ENCOUNTER — Ambulatory Visit: Payer: Commercial Managed Care - PPO | Admitting: Neurology

## 2019-06-27 ENCOUNTER — Telehealth: Payer: Commercial Managed Care - PPO | Admitting: Nurse Practitioner

## 2019-06-27 DIAGNOSIS — M545 Low back pain, unspecified: Secondary | ICD-10-CM

## 2019-06-27 MED ORDER — CYCLOBENZAPRINE HCL 10 MG PO TABS: 10.0000 mg | ORAL_TABLET | Freq: Three times a day (TID) | ORAL | 0 refills | Status: DC | PRN

## 2019-06-27 MED ORDER — NAPROXEN 500 MG PO TABS: 500.0000 mg | ORAL_TABLET | Freq: Two times a day (BID) | ORAL | 0 refills | Status: DC

## 2019-06-27 NOTE — Progress Notes (Signed)

## 2019-11-14 ENCOUNTER — Other Ambulatory Visit (INDEPENDENT_AMBULATORY_CARE_PROVIDER_SITE_OTHER): Payer: Self-pay

## 2019-11-15 ENCOUNTER — Other Ambulatory Visit: Payer: Self-pay | Admitting: Nurse Practitioner

## 2019-11-20 ENCOUNTER — Telehealth: Payer: Commercial Managed Care - PPO | Admitting: Family

## 2019-11-20 DIAGNOSIS — M545 Low back pain, unspecified: Secondary | ICD-10-CM

## 2019-11-20 MED ORDER — NAPROXEN 500 MG PO TABS: 500.0000 mg | ORAL_TABLET | Freq: Two times a day (BID) | ORAL | 0 refills | Status: DC

## 2019-11-20 MED ORDER — BACLOFEN 10 MG PO TABS: 10.0000 mg | ORAL_TABLET | Freq: Three times a day (TID) | ORAL | 0 refills | Status: DC

## 2019-11-20 NOTE — Progress Notes (Signed)

## 2020-01-21 ENCOUNTER — Other Ambulatory Visit: Payer: Self-pay

## 2020-01-31 ENCOUNTER — Telehealth: Payer: Self-pay | Admitting: Family

## 2020-01-31 DIAGNOSIS — L0291 Cutaneous abscess, unspecified: Secondary | ICD-10-CM

## 2020-01-31 MED ORDER — FLUCONAZOLE 150 MG PO TABS: 150.0000 mg | ORAL_TABLET | ORAL | 0 refills | Status: DC | PRN

## 2020-01-31 MED ORDER — SULFAMETHOXAZOLE-TRIMETHOPRIM 800-160 MG PO TABS: 1.0000 | ORAL_TABLET | Freq: Two times a day (BID) | ORAL | 0 refills | Status: DC

## 2020-01-31 NOTE — Addendum Note (Signed)
Addended by: Jannifer Rodney A on: 01/31/2020 09:19 AM   Modules accepted: Orders

## 2020-01-31 NOTE — Progress Notes (Signed)
E Visit for Cellulitis ° °We are sorry that you are not feeling well. Here is how we plan to help! ° °Based on what you shared with me it looks like you have cellulitis.  Cellulitis looks like areas of skin redness, swelling, and warmth; it develops as a result of bacteria entering under the skin. Little red spots and/or bleeding can be seen in skin, and tiny surface sacs containing fluid can occur. Fever can be present. Cellulitis is almost always on one side of a body, and the lower limbs are the most common site of involvement.  ° °I have prescribed:  Bactrim DS 1 tablet by mouth twice a day for 7 days. ° °HOME CARE: ° °Take your medications as ordered and take all of them, even if the skin irritation appears to be healing.  ° °GET HELP RIGHT AWAY IF: ° °Symptoms that don't begin to go away within 48 hours. °Severe redness persists or worsens °If the area turns color, spreads or swells. °If it blisters and opens, develops yellow-brown crust or bleeds. °You develop a fever or chills. °If the pain increases or becomes unbearable.  °Are unable to keep fluids and food down. ° °MAKE SURE YOU  ° °Understand these instructions. °Will watch your condition. °Will get help right away if you are not doing well or get worse. ° °Thank you for choosing an e-visit. ° °Your e-visit answers were reviewed by a board certified advanced clinical practitioner to complete your personal care plan. Depending upon the condition, your plan could have included both over the counter or prescription medications. ° °Please review your pharmacy choice. Make sure the pharmacy is open so you can pick up prescription now. If there is a problem, you may contact your provider through MyChart messaging and have the prescription routed to another pharmacy.  Your safety is important to us. If you have drug allergies check your prescription carefully.  ° °For the next 24 hours you can use MyChart to ask questions about today's visit, request a  non-urgent call back, or ask for a work or school excuse. °You will get an email in the next two days asking about your experience. I hope that your e-visit has been valuable and will speed your recovery. ° °Approximately 5 minutes was spent documenting and reviewing patient's chart.  ° ° °

## 2020-02-06 DIAGNOSIS — L723 Sebaceous cyst: Secondary | ICD-10-CM | POA: Diagnosis not present

## 2020-02-14 DIAGNOSIS — L02411 Cutaneous abscess of right axilla: Secondary | ICD-10-CM | POA: Diagnosis not present

## 2020-02-17 DIAGNOSIS — L02411 Cutaneous abscess of right axilla: Secondary | ICD-10-CM | POA: Diagnosis not present

## 2020-03-04 ENCOUNTER — Telehealth: Payer: Self-pay | Admitting: Physician Assistant

## 2020-03-04 DIAGNOSIS — L239 Allergic contact dermatitis, unspecified cause: Secondary | ICD-10-CM

## 2020-03-04 MED ORDER — PREDNISONE 10 MG PO TABS: ORAL_TABLET | ORAL | 0 refills | Status: AC

## 2020-03-04 NOTE — Progress Notes (Signed)
I have spent 5 minutes in review of e-visit questionnaire, review and updating patient chart, medical decision making and response to patient.   Trevaughn Schear Cody Layth Cerezo, PA-C    

## 2020-03-04 NOTE — Progress Notes (Signed)
E Visit for Rash  We are sorry that you are not feeling well. Here is how we plan to help!  Based on what you shared with me it looks like you have contact dermatitis.  Contact dermatitis is a skin rash caused by something that touches the skin and causes irritation or inflammation.  Your skin may be red, swollen, dry, cracked, and itch.  The rash should go away in a few days but can last a few weeks.  If you get a rash, it's important to figure out what caused it so the irritant can be avoided in the future. and I am prescribing a two week course of steroids (37 tablets of 10 mg prednisone).  Days 1-4 take 4 tablets (40 mg) daily  Days 5-8 take 3 tablets (30 mg) daily, Days 9-11 take 2 tablets (20 mg) daily, Days 12-14 take 1 tablet (10 mg) daily.   HOME CARE:   Take cool showers and avoid direct sunlight.  Apply cool compress or wet dressings.  Take a bath in an oatmeal bath.  Sprinkle content of one Aveeno packet under running faucet with comfortably warm water.  Bathe for 15-20 minutes, 1-2 times daily.  Pat dry with a towel. Do not rub the rash.  Use hydrocortisone cream.  Take an antihistamine like Benadryl for widespread rashes that itch.  The adult dose of Benadryl is 25-50 mg by mouth 4 times daily.  Caution:  This type of medication may cause sleepiness.  Do not drink alcohol, drive, or operate dangerous machinery while taking antihistamines.  Do not take these medications if you have prostate enlargement.  Read package instructions thoroughly on all medications that you take.  GET HELP RIGHT AWAY IF:   Symptoms don't go away after treatment.  Severe itching that persists.  If you rash spreads or swells.  If you rash begins to smell.  If it blisters and opens or develops a yellow-brown crust.  You develop a fever.  You have a sore throat.  You become short of breath.  MAKE SURE YOU:  Understand these instructions. Will watch your condition. Will get help right  away if you are not doing well or get worse.  Thank you for choosing an e-visit. Your e-visit answers were reviewed by a board certified advanced clinical practitioner to complete your personal care plan. Depending upon the condition, your plan could have included both over the counter or prescription medications. Please review your pharmacy choice. Be sure that the pharmacy you have chosen is open so that you can pick up your prescription now.  If there is a problem you may message your provider in MyChart to have the prescription routed to another pharmacy. Your safety is important to us. If you have drug allergies check your prescription carefully.  For the next 24 hours, you can use MyChart to ask questions about today's visit, request a non-urgent call back, or ask for a work or school excuse from your e-visit provider. You will get an email in the next two days asking about your experience. I hope that your e-visit has been valuable and will speed your recovery.      

## 2020-03-05 DIAGNOSIS — N898 Other specified noninflammatory disorders of vagina: Secondary | ICD-10-CM | POA: Diagnosis not present

## 2020-03-08 DIAGNOSIS — N762 Acute vulvitis: Secondary | ICD-10-CM | POA: Diagnosis not present

## 2020-03-08 DIAGNOSIS — L259 Unspecified contact dermatitis, unspecified cause: Secondary | ICD-10-CM | POA: Diagnosis not present

## 2020-03-08 DIAGNOSIS — R895 Abnormal microbiological findings in specimens from other organs, systems and tissues: Secondary | ICD-10-CM | POA: Diagnosis not present

## 2020-03-08 DIAGNOSIS — N946 Dysmenorrhea, unspecified: Secondary | ICD-10-CM | POA: Diagnosis not present

## 2020-03-28 ENCOUNTER — Telehealth: Payer: Self-pay | Admitting: Emergency Medicine

## 2020-03-28 DIAGNOSIS — M545 Low back pain, unspecified: Secondary | ICD-10-CM

## 2020-03-28 MED ORDER — BACLOFEN 10 MG PO TABS: 10.0000 mg | ORAL_TABLET | Freq: Three times a day (TID) | ORAL | 0 refills | Status: DC

## 2020-03-28 NOTE — Progress Notes (Signed)
Hi Jasmine Jennings,   Thank you for the additional information.   We are sorry that you are not feeling well.  Here is how we plan to help!  Based on what you have shared with me it looks like you mostly have acute back pain.  Acute back pain is defined as musculoskeletal pain that can resolve in 1-3 weeks with conservative treatment.  I have prescribedBaclofen 10 mg every eight hours as needed which is a muscle relaxer  Some patients experience stomach irritation or in increased heartburn with anti-inflammatory drugs.  Please keep in mind that muscle relaxer's can cause fatigue and should not be taken while at work or driving.  Back pain is very common.  The pain often gets better over time.  The cause of back pain is usually not dangerous.  Most people can learn to manage their back pain on their own.  Home Care  Stay active.  Start with short walks on flat ground if you can.  Try to walk farther each day.  Do not sit, drive or stand in one place for more than 30 minutes.  Do not stay in bed.  Do not avoid exercise or work.  Activity can help your back heal faster.  Be careful when you bend or lift an object.  Bend at your knees, keep the object close to you, and do not twist.  Sleep on a firm mattress.  Lie on your side, and bend your knees.  If you lie on your back, put a pillow under your knees.  Only take medicines as told by your doctor.  Put ice on the injured area.  Put ice in a plastic bag  Place a towel between your skin and the bag  Leave the ice on for 15-20 minutes, 3-4 times a day for the first 2-3 days. 210 After that, you can switch between ice and heat packs.  Ask your doctor about back exercises or massage.  Avoid feeling anxious or stressed.  Find good ways to deal with stress, such as exercise.  Get Help Right Way If:  Your pain does not go away with rest or medicine.  Your pain does not go away in 1 week.  You have new problems.  You do not feel  well.  The pain spreads into your legs.  You cannot control when you poop (bowel movement) or pee (urinate)  You feel sick to your stomach (nauseous) or throw up (vomit)  You have belly (abdominal) pain.  You feel like you may pass out (faint).  If you develop a fever.  Make Sure you:  Understand these instructions.  Will watch your condition  Will get help right away if you are not doing well or get worse.  Your e-visit answers were reviewed by a board certified advanced clinical practitioner to complete your personal care plan.  Depending on the condition, your plan could have included both over the counter or prescription medications.  If there is a problem please reply  once you have received a response from your provider.  Your safety is important to [place].  If you have drug allergies check your prescription carefully.    You can use MyChart to ask questions about today's visit, request a non-urgent call back, or ask for a work or school excuse for 24 hours related to this e-Visit. If it has been greater than 24 hours you will need to follow up with your provider, or enter a new e-Visit to address those  concerns.  You will get an e-mail in the next two days asking about your experience.  I hope that your e-visit has been valuable and will speed your recovery. Thank you for using e-visits.  Approximately 5 minutes was spent documenting and reviewing patient's chart.

## 2020-05-15 DIAGNOSIS — E7800 Pure hypercholesterolemia, unspecified: Secondary | ICD-10-CM | POA: Diagnosis not present

## 2020-05-15 DIAGNOSIS — F411 Generalized anxiety disorder: Secondary | ICD-10-CM | POA: Diagnosis not present

## 2020-05-15 DIAGNOSIS — G4700 Insomnia, unspecified: Secondary | ICD-10-CM | POA: Diagnosis not present

## 2020-05-15 DIAGNOSIS — G43909 Migraine, unspecified, not intractable, without status migrainosus: Secondary | ICD-10-CM | POA: Diagnosis not present

## 2020-09-11 ENCOUNTER — Emergency Department (HOSPITAL_COMMUNITY)
Admission: EM | Admit: 2020-09-11 | Discharge: 2020-09-12 | Disposition: A | Discharge status: Home / Self Care | Payer: BC Managed Care – PPO | Attending: Emergency Medicine | Admitting: Emergency Medicine

## 2020-09-11 ENCOUNTER — Encounter (HOSPITAL_COMMUNITY): Payer: Self-pay

## 2020-09-11 DIAGNOSIS — I10 Essential (primary) hypertension: Secondary | ICD-10-CM | POA: Diagnosis not present

## 2020-09-11 DIAGNOSIS — R45851 Suicidal ideations: Secondary | ICD-10-CM | POA: Diagnosis not present

## 2020-09-11 DIAGNOSIS — F1290 Cannabis use, unspecified, uncomplicated: Secondary | ICD-10-CM | POA: Insufficient documentation

## 2020-09-11 DIAGNOSIS — F32A Depression, unspecified: Secondary | ICD-10-CM | POA: Diagnosis not present

## 2020-09-11 DIAGNOSIS — Z91040 Latex allergy status: Secondary | ICD-10-CM | POA: Insufficient documentation

## 2020-09-11 DIAGNOSIS — Z20822 Contact with and (suspected) exposure to covid-19: Secondary | ICD-10-CM | POA: Insufficient documentation

## 2020-09-11 DIAGNOSIS — Z79899 Other long term (current) drug therapy: Secondary | ICD-10-CM | POA: Insufficient documentation

## 2020-09-11 DIAGNOSIS — R9431 Abnormal electrocardiogram [ECG] [EKG]: Secondary | ICD-10-CM | POA: Diagnosis not present

## 2020-09-11 DIAGNOSIS — Y900 Blood alcohol level of less than 20 mg/100 ml: Secondary | ICD-10-CM | POA: Insufficient documentation

## 2020-09-11 DIAGNOSIS — F332 Major depressive disorder, recurrent severe without psychotic features: Secondary | ICD-10-CM | POA: Insufficient documentation

## 2020-09-11 DIAGNOSIS — F1390 Sedative, hypnotic, or anxiolytic use, unspecified, uncomplicated: Secondary | ICD-10-CM | POA: Diagnosis not present

## 2020-09-11 DIAGNOSIS — R0902 Hypoxemia: Secondary | ICD-10-CM | POA: Diagnosis not present

## 2020-09-11 DIAGNOSIS — T443X2A Poisoning by other parasympatholytics [anticholinergics and antimuscarinics] and spasmolytics, intentional self-harm, initial encounter: Secondary | ICD-10-CM | POA: Diagnosis not present

## 2020-09-11 LAB — CBC WITH DIFFERENTIAL/PLATELET
Abs Immature Granulocytes: 0.02 10*3/uL (ref 0.00–0.07)
Basophils Absolute: 0.1 10*3/uL (ref 0.0–0.1)
Basophils Relative: 1 %
Eosinophils Absolute: 0.2 10*3/uL (ref 0.0–0.5)
Eosinophils Relative: 2 %
HCT: 37 % (ref 36.0–46.0)
Hemoglobin: 11.2 g/dL — ABNORMAL LOW (ref 12.0–15.0)
Immature Granulocytes: 0 %
Lymphocytes Relative: 37 %
Lymphs Abs: 3.6 10*3/uL (ref 0.7–4.0)
MCH: 23.5 pg — ABNORMAL LOW (ref 26.0–34.0)
MCHC: 30.3 g/dL (ref 30.0–36.0)
MCV: 77.7 fL — ABNORMAL LOW (ref 80.0–100.0)
Monocytes Absolute: 0.6 10*3/uL (ref 0.1–1.0)
Monocytes Relative: 6 %
Neutro Abs: 5.4 10*3/uL (ref 1.7–7.7)
Neutrophils Relative %: 54 %
Platelets: 414 10*3/uL — ABNORMAL HIGH (ref 150–400)
RBC: 4.76 MIL/uL (ref 3.87–5.11)
RDW: 14.5 % (ref 11.5–15.5)
WBC: 9.8 10*3/uL (ref 4.0–10.5)
nRBC: 0 % (ref 0.0–0.2)

## 2020-09-11 LAB — URINALYSIS, ROUTINE W REFLEX MICROSCOPIC
Bilirubin Urine: NEGATIVE
Glucose, UA: NEGATIVE mg/dL
Hgb urine dipstick: NEGATIVE
Ketones, ur: NEGATIVE mg/dL
Nitrite: NEGATIVE
Protein, ur: NEGATIVE mg/dL
Specific Gravity, Urine: 1.017 (ref 1.005–1.030)
pH: 5 (ref 5.0–8.0)

## 2020-09-11 LAB — COMPREHENSIVE METABOLIC PANEL
ALT: 15 U/L (ref 0–44)
AST: 17 U/L (ref 15–41)
Albumin: 4.2 g/dL (ref 3.5–5.0)
Alkaline Phosphatase: 63 U/L (ref 38–126)
Anion gap: 5 (ref 5–15)
BUN: 8 mg/dL (ref 6–20)
CO2: 28 mmol/L (ref 22–32)
Calcium: 9.6 mg/dL (ref 8.9–10.3)
Chloride: 109 mmol/L (ref 98–111)
Creatinine, Ser: 0.95 mg/dL (ref 0.44–1.00)
GFR, Estimated: >60 mL/min (ref >60)
Glucose, Bld: 98 mg/dL (ref 70–99)
Potassium: 3.8 mmol/L (ref 3.5–5.1)
Sodium: 142 mmol/L (ref 135–145)
Total Bilirubin: 0.5 mg/dL (ref 0.3–1.2)
Total Protein: 8.1 g/dL (ref 6.5–8.1)

## 2020-09-11 LAB — RESP PANEL BY RT-PCR (FLU A&B, COVID) ARPGX2
Influenza A by PCR: NEGATIVE
Influenza B by PCR: NEGATIVE
SARS Coronavirus 2 by RT PCR: NEGATIVE

## 2020-09-11 LAB — ETHANOL: Alcohol, Ethyl (B): <10 mg/dL (ref <10)

## 2020-09-11 LAB — RAPID URINE DRUG SCREEN, HOSP PERFORMED
Amphetamines: NOT DETECTED
Barbiturates: POSITIVE — AB
Benzodiazepines: POSITIVE — AB
Cocaine: NOT DETECTED
Opiates: NOT DETECTED
Tetrahydrocannabinol: POSITIVE — AB

## 2020-09-11 LAB — I-STAT BETA HCG BLOOD, ED (MC, WL, AP ONLY): I-stat hCG, quantitative: <5 m[IU]/mL (ref <5)

## 2020-09-11 MED ORDER — LORATADINE 10 MG PO TABS
10.0000 mg | ORAL_TABLET | Freq: Every day | ORAL | Status: DC
  Administered 2020-09-11 – 2020-09-12 (×2): 10 mg via ORAL
  Filled 2020-09-11 (×2): qty 1

## 2020-09-11 MED ORDER — CLONAZEPAM 0.5 MG PO TABS
0.5000 mg | ORAL_TABLET | Freq: Every day | ORAL | Status: DC | PRN
  Administered 2020-09-11: 0.5 mg via ORAL
  Filled 2020-09-11: qty 1

## 2020-09-11 MED ORDER — BUPROPION HCL ER (SR) 100 MG PO TB12
100.0000 mg | ORAL_TABLET | Freq: Every morning | ORAL | Status: DC
  Filled 2020-09-11: qty 1

## 2020-09-11 MED ORDER — ADULT MULTIVITAMIN W/MINERALS CH
1.0000 | ORAL_TABLET | Freq: Every day | ORAL | Status: DC
  Administered 2020-09-11 – 2020-09-12 (×2): 1 via ORAL
  Filled 2020-09-11 (×2): qty 1

## 2020-09-11 MED ORDER — BUTALBITAL-APAP-CAFFEINE 50-325-40 MG PO TABS: 1.0000 | ORAL_TABLET | ORAL | Status: DC | PRN

## 2020-09-11 MED ORDER — GABAPENTIN 300 MG PO CAPS: 300.0000 mg | ORAL_CAPSULE | Freq: Every day | ORAL | Status: DC | PRN

## 2020-09-11 MED ORDER — BUTALBITAL-APAP-CAFFEINE 50-300-40 MG PO CAPS: 1.0000 | ORAL_CAPSULE | ORAL | Status: DC | PRN

## 2020-09-11 MED ORDER — FLUTICASONE PROPIONATE 50 MCG/ACT NA SUSP: 1.0000 | Freq: Every day | NASAL | Status: DC | PRN

## 2020-09-11 MED ORDER — LORAZEPAM 2 MG/ML IJ SOLN
1.0000 mg | Freq: Once | INTRAMUSCULAR | Status: AC
  Administered 2020-09-11: 1 mg via INTRAMUSCULAR
  Filled 2020-09-11: qty 1

## 2020-09-11 MED ORDER — ESCITALOPRAM OXALATE 10 MG PO TABS
20.0000 mg | ORAL_TABLET | Freq: Every day | ORAL | Status: DC
  Administered 2020-09-11 – 2020-09-12 (×2): 20 mg via ORAL
  Filled 2020-09-11 (×2): qty 2

## 2020-09-11 MED ORDER — ASCORBIC ACID 500 MG PO TABS
1000.0000 mg | ORAL_TABLET | Freq: Every day | ORAL | Status: DC
  Administered 2020-09-11 – 2020-09-12 (×2): 1000 mg via ORAL
  Filled 2020-09-11 (×2): qty 2

## 2020-09-11 NOTE — ED Provider Notes (Signed)
[facility]-EMERGENCY DEPT Provider Note   CSN: [id number] Arrival date & time: 09/11/20  1442     History Chief Complaint  Patient presents with   Suicidal    Jasmine Jennings is a 45 y.o. female.  Pt presents to the ED today with a suicide attempt.  Pt said she has a hx of depression and anxiety.  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  She feels like she has been working so much and not dealing with emotions of her father's death and her daughter's dad's death last year.  She said she feels like she is finally feeling all those emotions now and it is overwhelming her.  She said she cries all the time.  She does not want to do anything.  She said she just wants to go to sleep and make it all go away.  Pt did take 150 mg po benadryl and 0.5 mg clonazepam prior to arrival.  She said that if she died, she would want to go outside to be with God.  She did call the suicide hotline.  She said she hung up on the suicide hotline, but the police came to her house and found her asleep.      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     Family History  Problem Relation Age of Onset   Hyperlipidemia Mother    Hypertension Mother    Diabetes Other     Social History   Tobacco Use    Smoking status: Never   Smokeless tobacco: Never  Substance Use Topics   Alcohol use: No    Alcohol/week: 0.0 standard drinks   Drug use: No    Home Medications Prior to Admission medications   Medication Sig Start Date End Date Taking? Authorizing Provider  APPLE CIDER VINEGAR PO Take 1 tablet by mouth 2 (two) times daily.   Yes [provider]  Ascorbic Acid (VITAMIN C) 1000 MG tablet Take 1,000 mg by mouth daily.   Yes [provider]  buPROPion ER (WELLBUTRIN SR) 100 MG 12 hr tablet Take 100 mg by mouth every morning. 03/16/20  Yes [provider]  Butalbital-APAP-Caffeine 50-300-40 MG CAPS Take 1 tablet by mouth every 4 (four) hours as needed (migraine). 12/10/14  Yes [provider]  clonazePAM (KLONOPIN) 0.5 MG tablet Take 0.5 mg by mouth daily as needed for anxiety. 08/29/20  Yes [provider]  escitalopram (LEXAPRO) 20 MG tablet Take 20 mg by mouth daily.   Yes [provider]  fluticasone (FLONASE) 50 MCG/ACT nasal spray Place 1 spray into both nostrils daily as needed for allergies or rhinitis.  Yes [provider]  gabapentin (NEURONTIN) 300 MG capsule Take 300 mg by mouth daily as needed (pain). 03/19/19  Yes [provider]  HYDROcodone-acetaminophen (NORCO) 10-325 MG tablet Take 1 tablet by mouth every 6 (six) hours as needed for moderate pain. 08/02/20  Yes [provider]  loratadine (CLARITIN) 10 MG tablet Take 10 mg by mouth daily.   Yes [provider]  Multiple Vitamin (MULTIVITAMIN WITH MINERALS) TABS tablet Take 1 tablet by mouth daily.   Yes [provider]  zolpidem (AMBIEN CR) 12.5 MG CR tablet Take 12.5 mg by mouth daily as needed for sleep. 12/25/14  Yes [provider]  baclofen (LIORESAL) 10 MG tablet Take 1 tablet (10 mg total) by mouth 3 (three) times daily. Patient not taking: No sig reported 03/28/20   Lurene Shadow, PA-C  clonazePAM (KLONOPIN) 1 MG  tablet TAKE 1/2 TO 1 TABLET BY MOUTH DAILY AS NEEDED FOR ANXIETY OR SLEEP Patient not taking: No sig reported 03/19/15   Shade Flood, MD  cyclobenzaprine (FLEXERIL) 10 MG tablet Take 1 tablet (10 mg total) by mouth 3 (three) times daily as needed for muscle spasms. Patient not taking: No sig reported 06/27/19   Bennie Pierini, FNP  naproxen (NAPROSYN) 500 MG tablet Take 1 tablet (500 mg total) by mouth 2 (two) times daily with a meal. Patient not taking: No sig reported 06/27/19   Bennie Pierini, FNP  naproxen (NAPROSYN) 500 MG tablet Take 1 tablet (500 mg total) by mouth 2 (two) times daily with a meal. Patient not taking: No sig reported 11/20/19   Jannifer Rodney A, FNP    Allergies    Amoxicillin, Ciprofibrate, Doxycycline, Latex, Shellfish allergy, Tape, Yeast-related products, and Ciprofloxacin  Review of Systems   Review of Systems  Psychiatric/Behavioral:  Positive for dysphoric mood and suicidal ideas. The patient is nervous/anxious.   All other systems reviewed and are negative.  Physical Exam Updated Vital Signs BP (!) 122/93 (BP Location: Right Arm)   Pulse 88   Temp 98.7 F (37.1 C) (Oral)   Resp 15   SpO2 100% Comment: Simultaneous filing. User may not have seen previous data.  Physical Exam Vitals and nursing note reviewed.  Constitutional:      Appearance: Normal appearance. She is obese.  HENT:     Head: Normocephalic and atraumatic.     Right Ear: External ear normal.     Left Ear: External ear normal.     Nose: Nose normal.     Mouth/Throat:     Mouth: Mucous membranes are moist.     Pharynx: Oropharynx is clear.  Eyes:     Extraocular Movements: Extraocular movements intact.     Conjunctiva/sclera: Conjunctivae normal.     Pupils: Pupils are equal, round, and reactive to light.  Cardiovascular:     Rate and Rhythm: Normal rate and regular rhythm.     Pulses: Normal pulses.     Heart sounds: Normal heart sounds.  Pulmonary:      Effort: Pulmonary effort is normal.     Breath sounds: Normal breath sounds.  Abdominal:     General: Abdomen is flat. Bowel sounds are normal.     Palpations: Abdomen is soft.  Musculoskeletal:        General: Normal range of motion.     Cervical back: Normal range of motion and neck supple.  Skin:    General: Skin is warm.     Capillary Refill: Capillary refill takes  less than 2 seconds.  Neurological:     General: No focal deficit present.     Mental Status: She is alert and oriented to person, place, and time.  Psychiatric:        Thought Content: Thought content includes suicidal ideation. Thought content includes suicidal plan.    ED Results / Procedures / Treatments   Labs (all labs ordered are listed, but only abnormal results are displayed) Labs Reviewed  CBC WITH DIFFERENTIAL/PLATELET - Abnormal; Notable for the following components:      Result Value   Hemoglobin 11.2 (*)    MCV 77.7 (*)    MCH 23.5 (*)    Platelets 414 (*)    All other components within normal limits  RAPID URINE DRUG SCREEN, HOSP PERFORMED - Abnormal; Notable for the following components:   Benzodiazepines POSITIVE (*)    Tetrahydrocannabinol POSITIVE (*)    Barbiturates POSITIVE (*)    All other components within normal limits  URINALYSIS, ROUTINE W REFLEX MICROSCOPIC - Abnormal; Notable for the following components:   APPearance HAZY (*)    Leukocytes,Ua TRACE (*)    Bacteria, UA FEW (*)    All other components within normal limits  RESP PANEL BY RT-PCR (FLU A&B, COVID) ARPGX2  COMPREHENSIVE METABOLIC PANEL  ETHANOL  I-STAT BETA HCG BLOOD, ED ([facility], [facility], [facility] ONLY)    EKG None  Radiology No results found.  Procedures Procedures   Medications Ordered in ED Medications  ascorbic acid (VITAMIN C) tablet 1,000 mg (1,000 mg Oral Given 09/11/20 1706)  buPROPion ER (WELLBUTRIN SR) 12 hr tablet 100 mg (has no administration in time range)  clonazePAM (KLONOPIN) tablet 0.5 mg (has no  administration in time range)  escitalopram (LEXAPRO) tablet 20 mg (20 mg Oral Given 09/11/20 1706)  fluticasone (FLONASE) 50 MCG/ACT nasal spray 1 spray (has no administration in time range)  gabapentin (NEURONTIN) capsule 300 mg (has no administration in time range)  loratadine (CLARITIN) tablet 10 mg (10 mg Oral Given 09/11/20 1706)  multivitamin with minerals tablet 1 tablet (1 tablet Oral Given 09/11/20 1705)  butalbital-acetaminophen-caffeine (FIORICET) 50-325-40 MG per tablet 1 tablet (has no administration in time range)    ED Course  I have reviewed the triage vital signs and the nursing notes.  Pertinent labs & imaging results that were available during my care of the patient were reviewed by me and considered in my medical decision making (see chart for details).    MDM Rules/Calculators/A&P                           Pt is medically clear for TTS consult.   Final Clinical Impression(s) / ED Diagnoses Final diagnoses:  Suicidal ideation  Anticholinergic drug overdose, intentional self-harm, initial encounter [facility])    Rx / DC Orders ED Discharge Orders     None        Jacalyn Lefevre, MD 09/11/20 1728

## 2020-09-11 NOTE — ED Notes (Signed)
Patient is stating she wants to leave. Patient states she is not being taken care of and her anxiety is increasing. Patient appears agitated. Dr Particia Nearing is speaking with the patient now.

## 2020-09-11 NOTE — ED Notes (Signed)
Patient moved to Room 16. I explained the process and gave timeline expectation of when she could be evaluated by TTS. I explained that sometimes this does not occur until 3-4am. Patient stated she understood.

## 2020-09-11 NOTE — ED Notes (Signed)
Patient is asleep and resting.

## 2020-09-11 NOTE — ED Triage Notes (Signed)
Pt presents to the ED via EMS for suicidal ideation with attempt. Per EMS, the pt ingested 150mg  of PO Benadryl and 0.5mg  of Clonazepam prior to arrival to the ED around 1400 today. EMS did not call poison control en route. Pt has a known hx of depression and anxiety. Per EMS, this is the pt's first suicide attempt and has not been hospitalized inpatient for psychiatric issues.

## 2020-09-11 NOTE — ED Notes (Signed)
Pt ambulatory in ED. 

## 2020-09-11 NOTE — ED Notes (Signed)
Pt's phone, wallet, belongings in cabinet labeled pt belongings Res A, 16-18. Pt's family member and pt witnessed pt belongings being put into cabinet. Pt's family member departed the dept at this time.

## 2020-09-12 DIAGNOSIS — T443X2A Poisoning by other parasympatholytics [anticholinergics and antimuscarinics] and spasmolytics, intentional self-harm, initial encounter: Secondary | ICD-10-CM | POA: Diagnosis not present

## 2020-09-12 DIAGNOSIS — R45851 Suicidal ideations: Secondary | ICD-10-CM

## 2020-09-12 DIAGNOSIS — R9431 Abnormal electrocardiogram [ECG] [EKG]: Secondary | ICD-10-CM | POA: Diagnosis not present

## 2020-09-12 NOTE — ED Notes (Signed)
TTS in progress 

## 2020-09-12 NOTE — ED Provider Notes (Signed)
Emergency Medicine Observation Re-evaluation Note  Jasmine Jennings is a 45 y.o. female, seen on rounds today.  Pt initially presented to the ED for complaints of Suicidal Currently, the patient is resting comfortably.  Physical Exam  BP 124/88 (BP Location: Left Arm)   Pulse 77   Temp 98.7 F (37.1 C) (Oral)   Resp 17   SpO2 97%  Physical Exam Vitals and nursing note reviewed.  Constitutional:      General: She is not in acute distress.    Appearance: She is well-developed.  HENT:     Head: Normocephalic and atraumatic.  Eyes:     Conjunctiva/sclera: Conjunctivae normal.  Cardiovascular:     Rate and Rhythm: Normal rate.     Heart sounds: No murmur heard. Pulmonary:     Effort: Pulmonary effort is normal. No respiratory distress.  Musculoskeletal:     Cervical back: Neck supple.  Skin:    General: Skin is warm and dry.  Neurological:     Mental Status: She is alert.     ED Course / MDM  EKG:EKG Interpretation  Date/Time:  Wednesday September 11 2020 15:18:34 EDT Ventricular Rate:  78 PR Interval:  130 QRS Duration: 80 QT Interval:  382 QTC Calculation: 435 R Axis:   40 Text Interpretation: Normal sinus rhythm Possible Left atrial enlargement Borderline ECG When compared with ECG of 06/08/2014, No significant change was found Confirmed by Dione Booze ([id number]) on 09/11/2020 11:49:43 PM  I have reviewed the labs performed to date as well as medications administered while in observation.  Recent changes in the last 24 hours include psychiatry evaluation recommending discharge.  Plan  Current plan is for discharge.  RAYEL SANTIZO is not under involuntary commitment.     Glendora Score, MD 09/12/20 1125

## 2020-09-12 NOTE — ED Notes (Signed)
Per TTS evaluation, "PA [facility] Riches recommends observation of patient and review by psychiatry later in the day."

## 2020-09-12 NOTE — Consult Note (Signed)
[facility] Psych ED Discharge  09/12/2020 12:34 PM Jasmine Jennings  MRN:  [id number]  Method of visit?: Face to Face   Principal Problem: Verbalizes suicidal thoughts Discharge Diagnoses: Principal Problem:   Verbalizes suicidal thoughts   Subjective: per admit note:  Pt presents to the ED today with a suicide attempt.  Pt said she has a hx of depression and anxiety.  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  She feels like she has been working so much and not dealing with emotions of her father's death and her daughter's dad's death last year.  She said she feels like she is finally feeling all those emotions now and it is overwhelming her.  She said she cries all the time.  She does not want to do anything.  She said she just wants to go to sleep and make it all go away.  Pt did take 150 mg po benadryl and 0.5 mg clonazepam prior to arrival.  She said that if she died, she would want to go outside to be with God.  She did call the suicide hotline.  She said she hung up on the suicide hotline, but the police came to her house and found her asleep.  Today, patient was seen face to face by this provider at [facility]. Patient is standing in room and appears to be tidying the environment upon arrival. She is friendly, cooperative, and welcoming of this provider to talk. 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 She does not describe taking much time for self-care. She has had some loss, her dad passed away and her daughter's father died last 2022/06/03 in a motorcycle accident. Her daughter is 55 years old and is still processing this lost. She has tried to be there for her daughter through this process.   Patient reports that she had this over whelming feeling of being tired yesterday and needed to get some rest, she took one klonopin and 6 OTC benadryl, thought  about taking her life, called the suicide hotline and next thing she remembers is a lot of people were at her home checking on her. She was transported to the ED for psych hold.  Today, she denies suicidal ideation, no intent, no plan, denies homicidal ideation, denies auditory and visual hallucinations. Able to contract for safety. Never seen a psychiatrist or therapist, has utilized EAP after the passing of her daughter's dad in 06-03-2022. This was helpful for her. PCP with [facility], takes Lexapro, Wellbutrin, and Klonopin prn. Complaint with current medications.   Support systems is her sister, however, she reports that she has not wanted to burden her sister with her problems.  Lives alone, and works full-time plus. She is tearful once during interview, otherwise she is happy and grateful for the ear to listen to her process.     Total Time spent with patient: 30 minutes  Past Psychiatric History: depression and anxiety  Past Medical History:  Past Medical History:  Diagnosis Date   Allergy    Migraines    MRSA cellulitis of left foot    L foot   Panic attacks    Vertigo     Past Surgical History:  Procedure Laterality Date   cryo     LEEP     Family History:  Family History  Problem Relation Age of Onset   Hyperlipidemia Mother    Hypertension Mother    Diabetes Other    Family Psychiatric  History: unknown Social History:  Social History   Substance and Sexual Activity  Alcohol Use No   Alcohol/week: 0.0 standard drinks     Social History   Substance and Sexual Activity  Drug Use No    Social History   Socioeconomic History   Marital status: Single    Spouse name: Not on file   Number of children: Not on file   Years of education: Not on file   Highest education level: Not on file  Occupational History   Not on file  Tobacco Use   Smoking status: Never   Smokeless tobacco: Never  Substance and Sexual Activity   Alcohol use: No    Alcohol/week: 0.0  standard drinks   Drug use: No   Sexual activity: Never  Other Topics Concern   Not on file  Social History Narrative   Not on file   Social Determinants of Health   Financial Resource Strain: Not on file  Food Insecurity: Not on file  Transportation Needs: Not on file  Physical Activity: Not on file  Stress: Not on file  Social Connections: Not on file    Tobacco Cessation:  N/A, patient does not currently use tobacco products  Current Medications: Current Facility-Administered Medications  Medication Dose Route Frequency Provider Last Rate Last Admin   ascorbic acid (VITAMIN C) tablet 1,000 mg  1,000 mg Oral Daily Jacalyn Lefevre, MD   1,000 mg at 09/12/20 1028   buPROPion ER (WELLBUTRIN SR) 12 hr tablet 100 mg  100 mg Oral q morning Jacalyn Lefevre, MD       butalbital-acetaminophen-caffeine (FIORICET) 50-325-40 MG per tablet 1 tablet  1 tablet Oral Q4H PRN Jacalyn Lefevre, MD       clonazePAM Scarlette Calico) tablet 0.5 mg  0.5 mg Oral Daily PRN Jacalyn Lefevre, MD   0.5 mg at 09/11/20 1938   escitalopram (LEXAPRO) tablet 20 mg  20 mg Oral Daily Jacalyn Lefevre, MD   20 mg at 09/12/20 1028   fluticasone (FLONASE) 50 MCG/ACT nasal spray 1 spray  1 spray Each Nare Daily PRN Jacalyn Lefevre, MD       gabapentin (NEURONTIN) capsule 300 mg  300 mg Oral Daily PRN Jacalyn Lefevre, MD       loratadine (CLARITIN) tablet 10 mg  10 mg Oral Daily Jacalyn Lefevre, MD   10 mg at 09/12/20 1028   multivitamin with minerals tablet 1 tablet  1 tablet Oral Daily Jacalyn Lefevre, MD   1 tablet at 09/12/20 1028   Current Outpatient Medications  Medication Sig Dispense Refill   APPLE CIDER VINEGAR PO Take 1 tablet by mouth 2 (two) times daily.     Ascorbic Acid (VITAMIN C) 1000 MG tablet Take 1,000 mg by mouth daily.     buPROPion ER (WELLBUTRIN SR) 100 MG 12 hr tablet Take 100 mg by mouth every morning.     Butalbital-APAP-Caffeine 50-300-40 MG CAPS Take 1 tablet by mouth every 4 (four) hours as  needed (migraine).  0   clonazePAM (KLONOPIN) 0.5 MG tablet Take 0.5 mg by mouth daily as needed for anxiety.     escitalopram (LEXAPRO) 20 MG tablet Take 20 mg by mouth daily.     fluticasone (FLONASE) 50 MCG/ACT nasal spray Place 1 spray into both nostrils daily as needed for allergies or rhinitis.     gabapentin (  NEURONTIN) 300 MG capsule Take 300 mg by mouth daily as needed (pain).     HYDROcodone-acetaminophen (NORCO) 10-325 MG tablet Take 1 tablet by mouth every 6 (six) hours as needed for moderate pain.     loratadine (CLARITIN) 10 MG tablet Take 10 mg by mouth daily.     Multiple Vitamin (MULTIVITAMIN WITH MINERALS) TABS tablet Take 1 tablet by mouth daily.     zolpidem (AMBIEN CR) 12.5 MG CR tablet Take 12.5 mg by mouth daily as needed for sleep.  0   baclofen (LIORESAL) 10 MG tablet Take 1 tablet (10 mg total) by mouth 3 (three) times daily. (Patient not taking: No sig reported) 15 each 0   clonazePAM (KLONOPIN) 1 MG tablet TAKE 1/2 TO 1 TABLET BY MOUTH DAILY AS NEEDED FOR ANXIETY OR SLEEP (Patient not taking: No sig reported) 30 tablet 1   cyclobenzaprine (FLEXERIL) 10 MG tablet Take 1 tablet (10 mg total) by mouth 3 (three) times daily as needed for muscle spasms. (Patient not taking: No sig reported) 30 tablet 0   naproxen (NAPROSYN) 500 MG tablet Take 1 tablet (500 mg total) by mouth 2 (two) times daily with a meal. (Patient not taking: No sig reported) 40 tablet 0   naproxen (NAPROSYN) 500 MG tablet Take 1 tablet (500 mg total) by mouth 2 (two) times daily with a meal. (Patient not taking: No sig reported) 30 tablet 0   PTA Medications: (Not in a hospital admission)   Musculoskeletal: Strength & Muscle Tone: within normal limits Gait & Station: normal Patient leans: N/A  Psychiatric Specialty Exam:  Presentation  General Appearance:  Appropriate for Environment Eye Contact: Good Speech: Clear and Coherent; Normal Rate Speech Volume: Normal Handedness: No data  recorded  Mood and Affect  Mood: Euthymic Affect: Congruent; Tearful  Thought Process  Thought Processes: Coherent; Goal Directed Descriptions of Associations:Intact Orientation:Full (Time, Place and Person) Thought Content:Logical History of Schizophrenia/Schizoaffective disorder:No  Duration of Psychotic Symptoms:No data recorded Hallucinations:Hallucinations: None Ideas of Reference:None Suicidal Thoughts:Suicidal Thoughts: No Homicidal Thoughts:Homicidal Thoughts: No  Sensorium  Memory: Immediate Good; Recent Good; Remote Good Judgment: Good Insight: Good  Executive Functions  Concentration: Good Attention Span: Good Recall: Good Fund of Knowledge: Good Language: Good  Psychomotor Activity  Psychomotor Activity: Psychomotor Activity: Normal  Assets  Assets: Communication Skills; Desire for Improvement; Financial Resources/Insurance; Housing; Leisure Time; Physical Health; Resilience; Social Support; Talents/Skills; Transportation; Vocational/Educational  Sleep  Sleep: Sleep: Good   Physical Exam: Physical Exam Vitals and nursing note reviewed.  Constitutional:      Appearance: Normal appearance.  Cardiovascular:     Rate and Rhythm: Normal rate.  Pulmonary:     Effort: Pulmonary effort is normal.  Musculoskeletal:        General: Normal range of motion.  Skin:    General: Skin is warm and dry.  Neurological:     Mental Status: She is alert and oriented to person, place, and time.  Psychiatric:        Attention and Perception: Attention normal.        Mood and Affect: Affect is tearful.        Speech: Speech normal.        Behavior: Behavior normal. Behavior is cooperative.        Thought Content: Thought content is not paranoid or delusional. Thought content does not include homicidal or suicidal ideation. Thought content does not include homicidal or suicidal plan.        Cognition and  Memory: Cognition normal.        Judgment:  Judgment normal.   Review of Systems  Constitutional:  Negative for chills and fever.  Respiratory:  Negative for shortness of breath.   Cardiovascular:  Negative for chest pain.  Gastrointestinal:  Negative for abdominal pain, nausea and vomiting.  Neurological:  Negative for weakness and headaches.  Psychiatric/Behavioral:  Negative for depression, hallucinations, memory loss, substance abuse and suicidal ideas. The patient is not nervous/anxious and does not have insomnia.   Blood pressure 124/88, pulse 77, temperature 98.7 F (37.1 C), temperature source Oral, resp. rate 17, SpO2 97 %. There is no height or weight on file to calculate BMI.   Demographic Factors:  NA  Loss Factors: NA  Historical Factors: NA  Risk Reduction Factors:   Sense of responsibility to family, Employed, and Positive social support  Continued Clinical Symptoms:  Depression  Cognitive Features That Contribute To Risk:  None    Suicide Risk:  Mild:  Suicidal ideation of limited frequency, intensity, duration, and specificity.  There are no identifiable plans, no associated intent, mild dysphoria and related symptoms, good self-control (both objective and subjective assessment), few other risk factors, and identifiable protective factors, including available and accessible social support.   Follow-up Information     [facility] [place] In 1 week.                  Plan Of Care/Follow-up recommendations:  Other:  Safe for outpatient psychiatric treatment.   Disposition: Psychiatrically cleared with resources provided at discharge. Discussed partial hospital program, unable to commit to this due to employment. Wishes for outpatient resources and will seek help through her employers [employer].  Agrees with plan to discharge with services. Safety planning reviewed in detail.   Novella OliveKaren R Renai Lopata, NP 09/12/2020, 12:34 PM
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
## 2020-09-12 NOTE — ED Provider Notes (Signed)
TTS consultation is appreciated.  Recommendation is for patient to be held in the emergency department for reevaluation by psychiatry in the morning.   Dione Booze, MD 09/12/20 [phone]

## 2020-09-12 NOTE — ED Notes (Signed)
Readjusted bed for patients comfort. She is not back asleep.

## 2020-09-12 NOTE — ED Notes (Signed)
Patient is resting in bed and is aware of plan to be reassessed.

## 2020-09-12 NOTE — BH Assessment (Signed)
[facility] Assessment Progress Note   Per Dorena Bodo, NP, this voluntary pt does not require psychiatric hospitalization at this time.  Pt is psychiatrically cleared.  Discharge instructions include referrals for several area providers of psychiatry and therapy.  EDP Glendora Score, MD and pt's nurse, Nash Dimmer, have been notified.  Doylene Canning, MA Triage Specialist [phone]

## 2020-09-12 NOTE — ED Notes (Signed)
Breakfast tray given. °

## 2020-09-12 NOTE — Discharge Instructions (Addendum)
For your behavioral health needs you are advised to follow up with an outpatient provider.  The following practices offer psychiatry and therapy.  Contact them at your earliest opportunity to schedule an intake appointment:       [facility] [address]      [place], [place] [address]      [phone]       [facility]      [address]      [place], [place] [address]      [phone]       [facility]      [phone]      [place], [place] [address]      [phone]   Suicide prevention:  Call 911 Call suicide crisis hotline [phone] Call [facility] [phone] Return to nearest emergency department

## 2020-09-12 NOTE — ED Notes (Signed)
Pt given belongings.

## 2020-09-28 ENCOUNTER — Telehealth: Payer: BC Managed Care – PPO | Admitting: Nurse Practitioner

## 2020-09-28 DIAGNOSIS — G8929 Other chronic pain: Secondary | ICD-10-CM

## 2020-09-28 NOTE — Progress Notes (Signed)
Based on what you shared with me, I feel your condition warrants further evaluation and I recommend that you be seen in a face to face visit.  Jasmine Jennings,  We have noted that you have been seen three times via e-visit for back pain in the past. The e-visit services are designed to help patients with acute symptoms, but is not for chronic illness.   Since you have had back pain at this level recur three or more time in the past year we recommend you have an in person evaluation for your symptoms as you may require imaging of your back to diagnose the cause of your recurrent pain.   If you have any questions please let us know.    NOTE: There will be NO CHARGE for this eVisit   If you are having a true medical emergency please call 911.      For an urgent face to face visit, [facility] has six urgent care centers for your convenience:     [facility] at [facility] Directions [phone] [address] [place], [place] [address]    [facility]) Get Driving Directions [phone] [address] [place], [place] [address]  [facility] [place]) Get Driving Directions [phone] [address] [place],  [place]  [address]  [facility] Health Urgent Care at [facility] Get Driving Directions [phone] [address] [place], [place] [address]   [facility] Care at [facility] Get Driving Directions  [phone] [address] [place], [place] [address]   [facility] Care at [facility] Directions [phone] [address] [place], [place] [address]  Your MyChart E-visit questionnaire answers were reviewed by a board certified advanced clinical practitioner to complete your personal care plan based on your specific symptoms.  Thank you for using e-Visits.

## 2020-10-07 ENCOUNTER — Telehealth: Payer: BC Managed Care – PPO | Admitting: Emergency Medicine

## 2020-10-07 DIAGNOSIS — N898 Other specified noninflammatory disorders of vagina: Secondary | ICD-10-CM | POA: Diagnosis not present

## 2020-10-07 MED ORDER — FLUCONAZOLE 150 MG PO TABS: 150.0000 mg | ORAL_TABLET | Freq: Once | ORAL | 0 refills | Status: AC

## 2020-10-07 NOTE — Progress Notes (Signed)

## 2020-10-18 DIAGNOSIS — Z01419 Encounter for gynecological examination (general) (routine) without abnormal findings: Secondary | ICD-10-CM | POA: Diagnosis not present

## 2020-10-18 DIAGNOSIS — Z6834 Body mass index (BMI) 34.0-34.9, adult: Secondary | ICD-10-CM | POA: Diagnosis not present

## 2020-10-18 DIAGNOSIS — Z1231 Encounter for screening mammogram for malignant neoplasm of breast: Secondary | ICD-10-CM | POA: Diagnosis not present

## 2020-11-21 DIAGNOSIS — G43909 Migraine, unspecified, not intractable, without status migrainosus: Secondary | ICD-10-CM | POA: Diagnosis not present

## 2020-11-21 DIAGNOSIS — E7800 Pure hypercholesterolemia, unspecified: Secondary | ICD-10-CM | POA: Diagnosis not present

## 2020-11-21 DIAGNOSIS — F411 Generalized anxiety disorder: Secondary | ICD-10-CM | POA: Diagnosis not present

## 2020-11-21 DIAGNOSIS — G4700 Insomnia, unspecified: Secondary | ICD-10-CM | POA: Diagnosis not present

## 2020-11-21 DIAGNOSIS — Z23 Encounter for immunization: Secondary | ICD-10-CM | POA: Diagnosis not present

## 2021-01-27 DIAGNOSIS — F411 Generalized anxiety disorder: Secondary | ICD-10-CM | POA: Diagnosis not present

## 2021-01-27 DIAGNOSIS — G4700 Insomnia, unspecified: Secondary | ICD-10-CM | POA: Diagnosis not present

## 2021-02-20 DIAGNOSIS — E785 Hyperlipidemia, unspecified: Secondary | ICD-10-CM | POA: Diagnosis not present

## 2021-05-01 ENCOUNTER — Telehealth: Payer: BC Managed Care – PPO | Admitting: Emergency Medicine

## 2021-05-01 DIAGNOSIS — M546 Pain in thoracic spine: Secondary | ICD-10-CM | POA: Diagnosis not present

## 2021-05-02 MED ORDER — NAPROXEN 500 MG PO TABS: 500.0000 mg | ORAL_TABLET | Freq: Two times a day (BID) | ORAL | 0 refills | Status: DC

## 2021-05-02 MED ORDER — BACLOFEN 10 MG PO TABS: 10.0000 mg | ORAL_TABLET | Freq: Three times a day (TID) | ORAL | 0 refills | Status: DC

## 2021-05-02 NOTE — Progress Notes (Signed)

## 2021-05-21 DIAGNOSIS — G4700 Insomnia, unspecified: Secondary | ICD-10-CM | POA: Diagnosis not present

## 2021-05-21 DIAGNOSIS — E7800 Pure hypercholesterolemia, unspecified: Secondary | ICD-10-CM | POA: Diagnosis not present

## 2021-05-21 DIAGNOSIS — Z79899 Other long term (current) drug therapy: Secondary | ICD-10-CM | POA: Diagnosis not present

## 2021-05-21 DIAGNOSIS — F411 Generalized anxiety disorder: Secondary | ICD-10-CM | POA: Diagnosis not present

## 2021-06-05 ENCOUNTER — Telehealth: Payer: BC Managed Care – PPO | Admitting: Family Medicine

## 2021-06-05 DIAGNOSIS — B3731 Acute candidiasis of vulva and vagina: Secondary | ICD-10-CM | POA: Diagnosis not present

## 2021-06-06 MED ORDER — FLUCONAZOLE 150 MG PO TABS: 150.0000 mg | ORAL_TABLET | Freq: Once | ORAL | 0 refills | Status: AC

## 2021-06-06 NOTE — Progress Notes (Signed)

## 2021-06-17 DIAGNOSIS — F331 Major depressive disorder, recurrent, moderate: Secondary | ICD-10-CM | POA: Diagnosis not present

## 2021-06-17 DIAGNOSIS — F411 Generalized anxiety disorder: Secondary | ICD-10-CM | POA: Diagnosis not present

## 2021-06-24 DIAGNOSIS — F411 Generalized anxiety disorder: Secondary | ICD-10-CM | POA: Diagnosis not present

## 2021-06-24 DIAGNOSIS — F331 Major depressive disorder, recurrent, moderate: Secondary | ICD-10-CM | POA: Diagnosis not present

## 2021-07-01 DIAGNOSIS — F411 Generalized anxiety disorder: Secondary | ICD-10-CM | POA: Diagnosis not present

## 2021-07-01 DIAGNOSIS — F331 Major depressive disorder, recurrent, moderate: Secondary | ICD-10-CM | POA: Diagnosis not present

## 2021-10-23 DIAGNOSIS — Z6835 Body mass index (BMI) 35.0-35.9, adult: Secondary | ICD-10-CM | POA: Diagnosis not present

## 2021-10-23 DIAGNOSIS — Z01419 Encounter for gynecological examination (general) (routine) without abnormal findings: Secondary | ICD-10-CM | POA: Diagnosis not present

## 2021-10-23 DIAGNOSIS — Z1231 Encounter for screening mammogram for malignant neoplasm of breast: Secondary | ICD-10-CM | POA: Diagnosis not present

## 2021-10-24 DIAGNOSIS — Z124 Encounter for screening for malignant neoplasm of cervix: Secondary | ICD-10-CM | POA: Diagnosis not present

## 2021-11-26 DIAGNOSIS — Z23 Encounter for immunization: Secondary | ICD-10-CM | POA: Diagnosis not present

## 2021-11-26 DIAGNOSIS — F411 Generalized anxiety disorder: Secondary | ICD-10-CM | POA: Diagnosis not present

## 2021-11-26 DIAGNOSIS — E7800 Pure hypercholesterolemia, unspecified: Secondary | ICD-10-CM | POA: Diagnosis not present

## 2021-11-26 DIAGNOSIS — G4700 Insomnia, unspecified: Secondary | ICD-10-CM | POA: Diagnosis not present

## 2021-11-26 DIAGNOSIS — M25562 Pain in left knee: Secondary | ICD-10-CM | POA: Diagnosis not present

## 2021-11-26 DIAGNOSIS — Z79899 Other long term (current) drug therapy: Secondary | ICD-10-CM | POA: Diagnosis not present

## 2021-11-27 ENCOUNTER — Emergency Department (HOSPITAL_COMMUNITY)
Admission: EM | Admit: 2021-11-27 | Discharge: 2021-11-27 | Disposition: A | Discharge status: Home / Self Care | Payer: BC Managed Care – PPO | Attending: Emergency Medicine | Admitting: Emergency Medicine

## 2021-11-27 ENCOUNTER — Other Ambulatory Visit: Payer: Self-pay

## 2021-11-27 ENCOUNTER — Encounter (HOSPITAL_COMMUNITY): Payer: Self-pay

## 2021-11-27 ENCOUNTER — Emergency Department (HOSPITAL_COMMUNITY): Payer: BC Managed Care – PPO

## 2021-11-27 DIAGNOSIS — R10823 Right lower quadrant rebound abdominal tenderness: Secondary | ICD-10-CM | POA: Insufficient documentation

## 2021-11-27 DIAGNOSIS — M79606 Pain in leg, unspecified: Secondary | ICD-10-CM | POA: Diagnosis not present

## 2021-11-27 DIAGNOSIS — R1031 Right lower quadrant pain: Secondary | ICD-10-CM | POA: Diagnosis not present

## 2021-11-27 DIAGNOSIS — Z91040 Latex allergy status: Secondary | ICD-10-CM | POA: Insufficient documentation

## 2021-11-27 DIAGNOSIS — M545 Low back pain, unspecified: Secondary | ICD-10-CM | POA: Insufficient documentation

## 2021-11-27 DIAGNOSIS — R944 Abnormal results of kidney function studies: Secondary | ICD-10-CM | POA: Diagnosis not present

## 2021-11-27 DIAGNOSIS — R109 Unspecified abdominal pain: Secondary | ICD-10-CM | POA: Diagnosis not present

## 2021-11-27 DIAGNOSIS — I959 Hypotension, unspecified: Secondary | ICD-10-CM | POA: Diagnosis not present

## 2021-11-27 DIAGNOSIS — T68XXXA Hypothermia, initial encounter: Secondary | ICD-10-CM | POA: Diagnosis not present

## 2021-11-27 LAB — URINALYSIS, ROUTINE W REFLEX MICROSCOPIC
Bilirubin Urine: NEGATIVE
Glucose, UA: NEGATIVE mg/dL
Hgb urine dipstick: NEGATIVE
Ketones, ur: NEGATIVE mg/dL
Nitrite: NEGATIVE
Protein, ur: NEGATIVE mg/dL
Specific Gravity, Urine: 1.016 (ref 1.005–1.030)
pH: 7 (ref 5.0–8.0)

## 2021-11-27 LAB — CBC WITH DIFFERENTIAL/PLATELET
Abs Immature Granulocytes: 0.01 10*3/uL (ref 0.00–0.07)
Basophils Absolute: 0 10*3/uL (ref 0.0–0.1)
Basophils Relative: 1 %
Eosinophils Absolute: 0.2 10*3/uL (ref 0.0–0.5)
Eosinophils Relative: 3 %
HCT: 36.1 % (ref 36.0–46.0)
Hemoglobin: 11 g/dL — ABNORMAL LOW (ref 12.0–15.0)
Immature Granulocytes: 0 %
Lymphocytes Relative: 33 %
Lymphs Abs: 2.4 10*3/uL (ref 0.7–4.0)
MCH: 23.9 pg — ABNORMAL LOW (ref 26.0–34.0)
MCHC: 30.5 g/dL (ref 30.0–36.0)
MCV: 78.3 fL — ABNORMAL LOW (ref 80.0–100.0)
Monocytes Absolute: 0.5 10*3/uL (ref 0.1–1.0)
Monocytes Relative: 7 %
Neutro Abs: 4.1 10*3/uL (ref 1.7–7.7)
Neutrophils Relative %: 56 %
Platelets: 324 10*3/uL (ref 150–400)
RBC: 4.61 MIL/uL (ref 3.87–5.11)
RDW: 14.3 % (ref 11.5–15.5)
WBC: 7.3 10*3/uL (ref 4.0–10.5)
nRBC: 0 % (ref 0.0–0.2)

## 2021-11-27 LAB — COMPREHENSIVE METABOLIC PANEL
ALT: 21 U/L (ref 0–44)
AST: 20 U/L (ref 15–41)
Albumin: 3.9 g/dL (ref 3.5–5.0)
Alkaline Phosphatase: 50 U/L (ref 38–126)
Anion gap: 6 (ref 5–15)
BUN: 11 mg/dL (ref 6–20)
CO2: 25 mmol/L (ref 22–32)
Calcium: 9.1 mg/dL (ref 8.9–10.3)
Chloride: 107 mmol/L (ref 98–111)
Creatinine, Ser: 1.22 mg/dL — ABNORMAL HIGH (ref 0.44–1.00)
GFR, Estimated: 56 mL/min — ABNORMAL LOW (ref >60)
Glucose, Bld: 106 mg/dL — ABNORMAL HIGH (ref 70–99)
Potassium: 4.3 mmol/L (ref 3.5–5.1)
Sodium: 138 mmol/L (ref 135–145)
Total Bilirubin: 0.5 mg/dL (ref 0.3–1.2)
Total Protein: 7.6 g/dL (ref 6.5–8.1)

## 2021-11-27 LAB — I-STAT BETA HCG BLOOD, ED (MC, WL, AP ONLY): I-stat hCG, quantitative: <5 m[IU]/mL (ref <5)

## 2021-11-27 LAB — LIPASE, BLOOD: Lipase: 27 U/L (ref 11–51)

## 2021-11-27 MED ORDER — ONDANSETRON HCL 4 MG/2ML IJ SOLN
4.0000 mg | Freq: Once | INTRAMUSCULAR | Status: AC
  Administered 2021-11-27: 4 mg via INTRAVENOUS
  Filled 2021-11-27: qty 2

## 2021-11-27 MED ORDER — IOHEXOL 300 MG/ML  SOLN
100.0000 mL | Freq: Once | INTRAMUSCULAR | Status: AC | PRN
  Administered 2021-11-27: 100 mL via INTRAVENOUS

## 2021-11-27 MED ORDER — MORPHINE SULFATE (PF) 4 MG/ML IV SOLN
4.0000 mg | Freq: Once | INTRAVENOUS | Status: AC
  Administered 2021-11-27: 4 mg via INTRAVENOUS
  Filled 2021-11-27: qty 1

## 2021-11-27 MED ORDER — SODIUM CHLORIDE 0.9 % IV BOLUS
500.0000 mL | Freq: Once | INTRAVENOUS | Status: AC
  Administered 2021-11-27: 500 mL via INTRAVENOUS

## 2021-11-27 MED ORDER — SODIUM CHLORIDE (PF) 0.9 % IJ SOLN
INTRAMUSCULAR | Status: AC
  Filled 2021-11-27: qty 50

## 2021-11-27 MED ORDER — DICYCLOMINE HCL 20 MG PO TABS: 20.0000 mg | ORAL_TABLET | Freq: Two times a day (BID) | ORAL | 0 refills | Status: DC

## 2021-11-27 MED ORDER — HYDROMORPHONE HCL 1 MG/ML IJ SOLN
1.0000 mg | Freq: Once | INTRAMUSCULAR | Status: AC
  Administered 2021-11-27: 1 mg via INTRAVENOUS
  Filled 2021-11-27: qty 1

## 2021-11-27 NOTE — Discharge Instructions (Addendum)
The CT of your abdomen and pelvis did not show any acute findings.  You were prescribed with medication such as Bentyl to help with spasms of your abdomen.  Please take 1 tablet twice a day for the next 5 days.  We also discussed adding MiraLAX to your diet in order to help with bowel movements.  Up with your primary care physician as needed.

## 2021-11-27 NOTE — ED Notes (Signed)
Given water

## 2021-11-27 NOTE — ED Provider Notes (Signed)
[facility]-EMERGENCY DEPT Provider Note   CSN: [id number] Arrival date & time: 11/27/21  7628     History Past medical history of hyperlipidemia  Chief Complaint  Patient presents with   Back Pain   Leg Pain   Abdominal Pain    Jasmine Jennings is a 46 y.o. female.  Patient presenting with right lower back pain since about 6 PM last night.  It is now radiating into her right lower quadrant.  She says she also is noticing some radiating symptoms down the back of her right leg.  She denies any fevers, chills, nausea, vomiting, diarrhea, constipation, dysuria, hematuria, vaginal discharge, vaginal bleeding.  SHe does note history of ovarian cyst in the past. She thinks she is also premenopausal.   Back Pain Associated symptoms: abdominal pain and leg pain   Leg Pain Associated symptoms: back pain   Abdominal Pain      Home Medications Prior to Admission medications   Medication Sig Start Date End Date Taking? Authorizing Provider  APPLE CIDER VINEGAR PO Take 1 tablet by mouth 2 (two) times daily.    [provider]  Ascorbic Acid (VITAMIN C) 1000 MG tablet Take 1,000 mg by mouth daily.    [provider]  baclofen (LIORESAL) 10 MG tablet Take 1 tablet (10 mg total) by mouth 3 (three) times daily. 05/02/21   Roxy Horseman, PA-C  buPROPion ER [facility] SR) 100 MG 12 hr tablet Take 100 mg by mouth every morning. 03/16/20   [provider]  Butalbital-APAP-Caffeine 50-300-40 MG CAPS Take 1 tablet by mouth every 4 (four) hours as needed (migraine). 12/10/14   [provider]  clonazePAM (KLONOPIN) 0.5 MG tablet Take 0.5 mg by mouth daily as needed for anxiety. 08/29/20   [provider]  clonazePAM (KLONOPIN) 1 MG tablet TAKE 1/2 TO 1 TABLET BY MOUTH DAILY AS NEEDED FOR ANXIETY OR SLEEP Patient not taking: No sig reported 03/19/15   Shade Flood, MD  cyclobenzaprine (FLEXERIL) 10 MG tablet Take 1 tablet (10 mg  total) by mouth 3 (three) times daily as needed for muscle spasms. Patient not taking: No sig reported 06/27/19   Daphine Deutscher, Mary-Margaret, FNP  escitalopram (LEXAPRO) 20 MG tablet Take 20 mg by mouth daily.    [provider]  fluticasone (FLONASE) 50 MCG/ACT nasal spray Place 1 spray into both nostrils daily as needed for allergies or rhinitis.    [provider]  gabapentin (NEURONTIN) 300 MG capsule Take 300 mg by mouth daily as needed (pain). 03/19/19   [provider]  HYDROcodone-acetaminophen (NORCO) 10-325 MG tablet Take 1 tablet by mouth every 6 (six) hours as needed for moderate pain. 08/02/20   [provider]  loratadine (CLARITIN) 10 MG tablet Take 10 mg by mouth daily.    [provider]  Multiple Vitamin (MULTIVITAMIN WITH MINERALS) TABS tablet Take 1 tablet by mouth daily.    [provider]  naproxen (NAPROSYN) 500 MG tablet Take 1 tablet (500 mg total) by mouth 2 (two) times daily with a meal. Patient not taking: No sig reported 06/27/19   Bennie Pierini, FNP  naproxen (NAPROSYN) 500 MG tablet Take 1 tablet (500 mg total) by mouth 2 (two) times daily with a meal. 05/02/21   Roxy Horseman, PA-C  zolpidem (AMBIEN CR) 12.5 MG CR tablet Take 12.5 mg by mouth daily as needed for sleep. 12/25/14   [provider]      Allergies  Amoxicillin, Ciprofibrate, Doxycycline, Latex, Shellfish allergy, Tape, Yeast-related products, and Ciprofloxacin    Review of Systems   Review of Systems  Gastrointestinal:  Positive for abdominal pain.  Genitourinary:  Positive for flank pain.  Musculoskeletal:  Positive for back pain.  All other systems reviewed and are negative.   Physical Exam Updated Vital Signs BP 131/88   Pulse 69   Temp 98.7 F (37.1 C) (Oral)   Resp 18   Ht 5\' 9"  (1.753 m)   Wt 107 kg   SpO2 98%   BMI 34.85 kg/m  Physical Exam Vitals and nursing note reviewed.  Constitutional:      General: She is  not in acute distress.    Appearance: Normal appearance. She is not ill-appearing, toxic-appearing or diaphoretic.  HENT:     Head: Normocephalic and atraumatic.     Nose: No nasal deformity.     Mouth/Throat:     Lips: Pink. No lesions.     Mouth: Mucous membranes are moist. No injury, lacerations, oral lesions or angioedema.     Pharynx: Oropharynx is clear. Uvula midline. No pharyngeal swelling, oropharyngeal exudate, posterior oropharyngeal erythema or uvula swelling.  Eyes:     General: Gaze aligned appropriately. No scleral icterus.       Right eye: No discharge.        Left eye: No discharge.     Conjunctiva/sclera: Conjunctivae normal.     Right eye: Right conjunctiva is not injected. No exudate or hemorrhage.    Left eye: Left conjunctiva is not injected. No exudate or hemorrhage. Cardiovascular:     Pulses: Normal pulses.          Radial pulses are 2+ on the right side and 2+ on the left side.       Dorsalis pedis pulses are 2+ on the right side and 2+ on the left side.     Heart sounds: S1 normal and S2 normal. Heart sounds not distant.     No S3 or S4 sounds.  Pulmonary:     Effort: Pulmonary effort is normal. No accessory muscle usage or respiratory distress.  Abdominal:     General: Abdomen is flat. There is no distension.     Palpations: Abdomen is soft. There is no mass or pulsatile mass.     Tenderness: There is abdominal tenderness in the right lower quadrant. There is right CVA tenderness. There is no guarding or rebound. Positive signs include McBurney's sign.     Hernia: No hernia is present.  Musculoskeletal:     Right lower leg: No edema.     Left lower leg: No edema.  Skin:    General: Skin is warm and dry.     Coloration: Skin is not jaundiced or pale.     Findings: No bruising, erythema, lesion or rash.  Neurological:     General: No focal deficit present.     Mental Status: She is alert and oriented to person, place, and time.     GCS: GCS eye  subscore is 4. GCS verbal subscore is 5. GCS motor subscore is 6.  Psychiatric:        Mood and Affect: Mood normal.        Behavior: Behavior normal. Behavior is cooperative.     ED Results / Procedures / Treatments   Labs (all labs ordered are listed, but only abnormal results are displayed) Labs Reviewed  CBC WITH DIFFERENTIAL/PLATELET - Abnormal; Notable for the following components:  Result Value   Hemoglobin 11.0 (*)    MCV 78.3 (*)    MCH 23.9 (*)    All other components within normal limits  COMPREHENSIVE METABOLIC PANEL - Abnormal; Notable for the following components:   Glucose, Bld 106 (*)    Creatinine, Ser 1.22 (*)    GFR, Estimated 56 (*)    All other components within normal limits  URINALYSIS, ROUTINE W REFLEX MICROSCOPIC - Abnormal; Notable for the following components:   Leukocytes,Ua TRACE (*)    Bacteria, UA RARE (*)    All other components within normal limits  LIPASE, BLOOD  I-STAT BETA HCG BLOOD, ED ([facility], [facility], [facility] ONLY)  I-STAT BETA HCG BLOOD, ED ([facility], [facility], [facility] ONLY)    EKG None  Radiology No results found.  Procedures Procedures    Medications Ordered in ED Medications  morphine (PF) 4 MG/ML injection 4 mg (4 mg Intravenous Given 11/27/21 0502)  ondansetron (ZOFRAN) injection 4 mg (4 mg Intravenous Given 11/27/21 0500)  sodium chloride 0.9 % bolus 500 mL (0 mLs Intravenous Stopped 11/27/21 0640)  HYDROmorphone (DILAUDID) injection 1 mg (1 mg Intravenous Given 11/27/21 0557)  iohexol (OMNIPAQUE) 300 MG/ML solution 100 mL (100 mLs Intravenous Contrast Given 11/27/21 0609)  sodium chloride (PF) 0.9 % injection (  Given by Other 11/27/21 [id number])    ED Course/ Medical Decision Making/ A&P Clinical Course as of 11/27/21 0713  Thu Nov 27, 2021  0701 I reassessed patient and symptoms with some improvement after medication. Still waiting on CT read to come back. [GL]    Clinical Course User Index [GL] Sherre Poot Adora Fridge, PA-C                            Medical Decision Making Amount and/or Complexity of Data Reviewed Labs: ordered. Radiology: ordered.  Risk Prescription drug management.    MDM  This is a 46 y.o. female who presents to the ED with R flank/RLQ abdominal pain The differential of this patient includes but is not limited to appendicitis, ovarian cyst, ovarian torsion, ectopic pregnancy, sciatica, MSK  Initial Impression  Well appearing, no acute distress Vitals are normal Exam with + RLQ tenderness Plan to obtain labs and CT a/p  I personally ordered, reviewed, and interpreted all laboratory work and imaging and agree with radiologist interpretation. Results interpreted below: No leukocytosis, Hgb 11 (stable), Creat 1.22 (up slightly), Lipase normal, pregnancy negative, UA  without wbc or rbc.   Assessment/Plan:  SO far patient has an unremarkable workup. She is awaiting final CT read. Anticipate discharge.  7:13 AM Care of DONNAH SCICCHITANO transferred to [facility] and Dr. Tamera Punt at the end of my shift as the patient will require reassessment once labs/imaging have resulted. Patient presentation, ED course, and plan of care discussed with review of all pertinent labs and imaging. Please see his/her note for further details regarding further ED course and disposition. Plan at time of handoff is f/u on CT imaging, reassess. This may be altered or completely changed at the discretion of the oncoming team pending results of further workup.    Charting Requirements Additional history is obtained from:  Independent historian External Records from outside source obtained and reviewed including: n/a Social Determinants of Health:  none Pertinant PMH that complicates patient's illness: n/a  Patient Care Problems that were addressed during this visit: - RLQ abdominal pain: Acute illness with systemic symptoms This patient was maintained on a  cardiac monitor/telemetry. I personally viewed and interpreted the cardiac monitor  which reveals an underlying rhythm of NSR Medications given in ED: Dilaudid, Morphine, zofran Reevaluation of the patient after these medicines showed that the patient improved I have reviewed home medications and made changes accordingly.  Critical Care Interventions: n/a Consultations: n/a Disposition: see oncoming provider note  Portions of this note were generated with Dragon dictation software. Dictation errors may occur despite best attempts at proofreading.     Final Clinical Impression(s) / ED Diagnoses Final diagnoses:  Right lower quadrant abdominal tenderness with rebound tenderness    Rx / DC Orders ED Discharge Orders     None         Adolphus Birchwood, PA-C [id number]    Delora Fuel, MD [id number] [phone]

## 2021-11-27 NOTE — ED Triage Notes (Signed)
Pt BIB EMS with reports of lower back pain that goes into her right groin and leg along with RLQ pain.

## 2021-11-27 NOTE — ED Provider Notes (Signed)
  Physical Exam  BP 131/88   Pulse 68   Temp 98.8 F (37.1 C) (Oral)   Resp 18   Ht 5\' 9"  (1.753 m)   Wt 107 kg   SpO2 100%   BMI 34.85 kg/m   Physical Exam Vitals and nursing note reviewed.  Constitutional:      Appearance: She is well-developed.  HENT:     Head: Normocephalic and atraumatic.  Cardiovascular:     Rate and Rhythm: Normal rate.  Pulmonary:     Effort: Pulmonary effort is normal.     Breath sounds: No wheezing or rales.  Abdominal:     Palpations: Abdomen is soft.     Tenderness: There is no abdominal tenderness. There is no guarding.  Skin:    General: Skin is warm and dry.  Neurological:     Mental Status: She is alert and oriented to person, place, and time.     Procedures  Procedures  ED Course / MDM   Clinical Course as of 11/27/21 0740  Thu Nov 27, 2021  0701 I reassessed patient and symptoms with some improvement after medication. Still waiting on CT read to come back. [GL]    Clinical Course User Index [GL] Nov 29, 2021 Victorino Dike, PA-C   Medical Decision Making Amount and/or Complexity of Data Reviewed Labs: ordered. Radiology: ordered.  Risk Prescription drug management.   Patient care assumed at shift change from Addison L. PA at shift change, please see her note for a full HPI. Briefly, patient here with right lower accident 6 PM last night and now radiating to her right lower quadrant she is also noticing symptoms radiating down her leg.  No fever, no vaginal discharge, no vaginal bleeding.  Plan is for pending CT after normal labs, did receive Dilaudid for pain control.  Ct abdomen showed: 1. No acute or focal lesion to explain the patient's symptoms.  2. Stool in the terminal ileum, suggesting slow transit.   7:38 AM patient reevaluated by me, does report some improvement in her pain however was placed on oxygen as Dilaudid brought her saturations down to 89.  This was discontinued by me.  She does report the pain in her stomach is  more severe than her back, therefore we will treat with a short course of Bentyl to help with spasms.  She is also recommended MiraLAX as there was a good amount of stool around her colon.  She is agreeable to plan and treatment, patient tolerating p.o. hemodynamically stable for discharge.   Portions of this note were generated with [place]. Dictation errors may occur despite best attempts at proofreading.         Scientist, clinical (histocompatibility and immunogenetics), PA-C 11/27/21 0740    11/29/21, MD 11/27/21 1101

## 2021-12-04 DIAGNOSIS — M25562 Pain in left knee: Secondary | ICD-10-CM | POA: Diagnosis not present

## 2022-02-20 ENCOUNTER — Other Ambulatory Visit: Payer: Self-pay | Admitting: Sports Medicine

## 2022-02-20 ENCOUNTER — Ambulatory Visit
Admission: RE | Admit: 2022-02-20 | Discharge: 2022-02-20 | Disposition: A | Discharge status: Home / Self Care | Payer: BC Managed Care – PPO | Source: Ambulatory Visit | Attending: Sports Medicine | Admitting: Sports Medicine

## 2022-02-20 DIAGNOSIS — M25562 Pain in left knee: Secondary | ICD-10-CM | POA: Diagnosis not present

## 2022-02-27 DIAGNOSIS — M25562 Pain in left knee: Secondary | ICD-10-CM | POA: Diagnosis not present

## 2022-03-03 ENCOUNTER — Other Ambulatory Visit: Payer: Self-pay | Admitting: Sports Medicine

## 2022-03-03 DIAGNOSIS — M25562 Pain in left knee: Secondary | ICD-10-CM

## 2022-03-17 ENCOUNTER — Ambulatory Visit
Admission: RE | Admit: 2022-03-17 | Discharge: 2022-03-17 | Disposition: A | Discharge status: Home / Self Care | Payer: BC Managed Care – PPO | Source: Ambulatory Visit | Attending: Sports Medicine | Admitting: Sports Medicine

## 2022-03-17 DIAGNOSIS — M25562 Pain in left knee: Secondary | ICD-10-CM

## 2022-05-20 DIAGNOSIS — F411 Generalized anxiety disorder: Secondary | ICD-10-CM | POA: Diagnosis not present

## 2022-05-20 DIAGNOSIS — G43909 Migraine, unspecified, not intractable, without status migrainosus: Secondary | ICD-10-CM | POA: Diagnosis not present

## 2022-05-20 DIAGNOSIS — Z79899 Other long term (current) drug therapy: Secondary | ICD-10-CM | POA: Diagnosis not present

## 2022-05-20 DIAGNOSIS — E7800 Pure hypercholesterolemia, unspecified: Secondary | ICD-10-CM | POA: Diagnosis not present

## 2022-05-20 DIAGNOSIS — G4700 Insomnia, unspecified: Secondary | ICD-10-CM | POA: Diagnosis not present

## 2022-05-28 ENCOUNTER — Telehealth: Payer: BC Managed Care – PPO | Admitting: Physician Assistant

## 2022-05-28 DIAGNOSIS — M545 Low back pain, unspecified: Secondary | ICD-10-CM

## 2022-05-28 MED ORDER — NAPROXEN 500 MG PO TABS: 500.0000 mg | ORAL_TABLET | Freq: Two times a day (BID) | ORAL | 0 refills | Status: DC

## 2022-05-28 MED ORDER — CYCLOBENZAPRINE HCL 10 MG PO TABS: 10.0000 mg | ORAL_TABLET | Freq: Three times a day (TID) | ORAL | 0 refills | Status: DC | PRN

## 2022-05-28 NOTE — Progress Notes (Signed)
I have spent 5 minutes in review of e-visit questionnaire, review and updating patient chart, medical decision making and response to patient.   Alaysha Jefcoat Cody Tucker Steedley, PA-C    

## 2022-05-28 NOTE — Progress Notes (Signed)

## 2022-08-16 DIAGNOSIS — S63502A Unspecified sprain of left wrist, initial encounter: Secondary | ICD-10-CM | POA: Diagnosis not present

## 2022-08-26 DIAGNOSIS — M25532 Pain in left wrist: Secondary | ICD-10-CM | POA: Diagnosis not present

## 2022-08-26 DIAGNOSIS — M25462 Effusion, left knee: Secondary | ICD-10-CM | POA: Diagnosis not present

## 2022-08-26 DIAGNOSIS — M222X2 Patellofemoral disorders, left knee: Secondary | ICD-10-CM | POA: Diagnosis not present

## 2022-08-26 DIAGNOSIS — M25862 Other specified joint disorders, left knee: Secondary | ICD-10-CM | POA: Diagnosis not present

## 2022-09-02 DIAGNOSIS — M25532 Pain in left wrist: Secondary | ICD-10-CM | POA: Diagnosis not present

## 2022-09-23 DIAGNOSIS — M25532 Pain in left wrist: Secondary | ICD-10-CM | POA: Diagnosis not present

## 2022-09-23 DIAGNOSIS — M25862 Other specified joint disorders, left knee: Secondary | ICD-10-CM | POA: Diagnosis not present

## 2022-10-19 DIAGNOSIS — M222X2 Patellofemoral disorders, left knee: Secondary | ICD-10-CM | POA: Diagnosis not present

## 2022-10-19 DIAGNOSIS — M25532 Pain in left wrist: Secondary | ICD-10-CM | POA: Diagnosis not present

## 2022-10-26 DIAGNOSIS — M25562 Pain in left knee: Secondary | ICD-10-CM | POA: Diagnosis not present

## 2022-11-02 DIAGNOSIS — M25562 Pain in left knee: Secondary | ICD-10-CM | POA: Diagnosis not present

## 2022-11-02 DIAGNOSIS — M25532 Pain in left wrist: Secondary | ICD-10-CM | POA: Diagnosis not present

## 2022-11-18 DIAGNOSIS — Z79899 Other long term (current) drug therapy: Secondary | ICD-10-CM | POA: Diagnosis not present

## 2022-11-18 DIAGNOSIS — E7800 Pure hypercholesterolemia, unspecified: Secondary | ICD-10-CM | POA: Diagnosis not present

## 2022-11-18 DIAGNOSIS — G4700 Insomnia, unspecified: Secondary | ICD-10-CM | POA: Diagnosis not present

## 2022-11-18 DIAGNOSIS — G43909 Migraine, unspecified, not intractable, without status migrainosus: Secondary | ICD-10-CM | POA: Diagnosis not present

## 2023-02-15 ENCOUNTER — Telehealth: Payer: BC Managed Care – PPO | Admitting: Physician Assistant

## 2023-02-15 DIAGNOSIS — M545 Low back pain, unspecified: Secondary | ICD-10-CM

## 2023-02-15 MED ORDER — CYCLOBENZAPRINE HCL 10 MG PO TABS: 5.0000 mg | ORAL_TABLET | Freq: Three times a day (TID) | ORAL | 0 refills | Status: DC | PRN

## 2023-02-15 MED ORDER — NAPROXEN 500 MG PO TABS: 500.0000 mg | ORAL_TABLET | Freq: Two times a day (BID) | ORAL | 0 refills | Status: DC

## 2023-02-15 NOTE — Progress Notes (Signed)

## 2023-02-25 ENCOUNTER — Emergency Department (HOSPITAL_COMMUNITY)
Admission: EM | Admit: 2023-02-25 | Discharge: 2023-02-25 | Disposition: A | Discharge status: Home / Self Care | Payer: BC Managed Care – PPO | Attending: Student | Admitting: Student

## 2023-02-25 ENCOUNTER — Other Ambulatory Visit: Payer: Self-pay

## 2023-02-25 ENCOUNTER — Emergency Department (HOSPITAL_COMMUNITY): Payer: BC Managed Care – PPO

## 2023-02-25 ENCOUNTER — Encounter (HOSPITAL_COMMUNITY): Payer: Self-pay

## 2023-02-25 DIAGNOSIS — S61551A Open bite of right wrist, initial encounter: Secondary | ICD-10-CM | POA: Diagnosis not present

## 2023-02-25 DIAGNOSIS — S61552A Open bite of left wrist, initial encounter: Secondary | ICD-10-CM | POA: Diagnosis not present

## 2023-02-25 DIAGNOSIS — W540XXA Bitten by dog, initial encounter: Secondary | ICD-10-CM | POA: Insufficient documentation

## 2023-02-25 DIAGNOSIS — S51851A Open bite of right forearm, initial encounter: Secondary | ICD-10-CM | POA: Diagnosis not present

## 2023-02-25 DIAGNOSIS — Z91040 Latex allergy status: Secondary | ICD-10-CM | POA: Diagnosis not present

## 2023-02-25 MED ORDER — DOXYCYCLINE HYCLATE 100 MG PO TABS
100.0000 mg | ORAL_TABLET | Freq: Once | ORAL | Status: AC
  Administered 2023-02-25: 100 mg via ORAL
  Filled 2023-02-25: qty 1

## 2023-02-25 MED ORDER — AMOXICILLIN-POT CLAVULANATE 875-125 MG PO TABS: 1.0000 | ORAL_TABLET | Freq: Two times a day (BID) | ORAL | 0 refills | Status: DC

## 2023-02-25 MED ORDER — KETOROLAC TROMETHAMINE 15 MG/ML IJ SOLN
15.0000 mg | Freq: Once | INTRAMUSCULAR | Status: AC
  Administered 2023-02-25: 15 mg via INTRAMUSCULAR
  Filled 2023-02-25: qty 1

## 2023-02-25 NOTE — ED Provider Notes (Signed)
[place] EMERGENCY DEPARTMENT AT [facility] Provider Note  CSN: [id number] Arrival date & time: 02/25/23 0208  Chief Complaint(s) Animal Bite  HPI KANANI MOWBRAY is a 48 y.o. female who presents emergency room for evaluation of a dog bite.  Patient states that her daughter's pitbull bit her on both wrists after taking the dog for a walk.  The dog is vaccinated and not displaying any erratic or bizarre behaviors.  She arrives with 2 puncture wounds on the right wrist and complaints of wrist pain bilaterally.  Denies any additional traumatic complaints.   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 Authorizing Provider  APPLE CIDER VINEGAR PO Take 1 tablet by mouth 2 (two) times daily.    [provider]  Ascorbic Acid (VITAMIN C) 1000 MG tablet Take 1,000 mg by mouth daily.    [provider]  buPROPion ER (WELLBUTRIN SR) 100 MG 12 hr tablet Take 100 mg by mouth every morning. 03/16/20   [provider]  Butalbital-APAP-Caffeine 50-300-40 MG CAPS Take 1 tablet by mouth every 4 (four) hours as needed (migraine). 12/10/14   [provider]  clonazePAM (KLONOPIN) 0.5 MG tablet Take 0.5 mg by mouth daily as needed for anxiety. 08/29/20   [provider]  cyclobenzaprine (FLEXERIL)  10 MG tablet Take 0.5-1 tablets (5-10 mg total) by mouth 3 (three) times daily as needed. 02/15/23   Margaretann Loveless, PA-C  dicyclomine (BENTYL) 20 MG tablet Take 1 tablet (20 mg total) by mouth 2 (two) times daily for 5 days. 11/27/21 12/02/21  Claude Manges, PA-C  escitalopram (LEXAPRO) 20 MG tablet Take 20 mg by mouth daily.    [provider]  fluticasone (FLONASE) 50 MCG/ACT nasal spray Place 1 spray into both nostrils daily as needed for allergies or rhinitis.    [provider]  gabapentin (NEURONTIN) 300 MG capsule Take 300 mg by mouth daily as needed (pain). 03/19/19   [provider]  loratadine (CLARITIN) 10 MG tablet Take 10 mg by mouth daily.    [provider]  Multiple Vitamin (MULTIVITAMIN WITH MINERALS) TABS tablet Take 1 tablet by mouth daily.    [provider]  naproxen (NAPROSYN) 500 MG tablet Take 1 tablet (500 mg total) by mouth 2 (two) times daily with a meal. 02/15/23   Burnette, Alessandra Bevels, PA-C  zolpidem (AMBIEN CR) 12.5 MG CR tablet Take 12.5 mg by mouth daily as needed for sleep. 12/25/14   [provider]  Past Surgical History Past Surgical History:  Procedure Laterality Date   cryo     LEEP     Family History Family History  Problem Relation Age of Onset   Hyperlipidemia Mother    Hypertension Mother    Diabetes Other     Social History Social History   Tobacco Use   Smoking status: Never   Smokeless tobacco: Never  Substance Use Topics   Alcohol use: No    Alcohol/week: 0.0 standard drinks of alcohol   Drug use: No   Allergies Amoxicillin, Ciprofibrate, Doxycycline, Latex, Shellfish allergy, Tape, Yeast-derived drug products, and Ciprofloxacin  Review of Systems Review of Systems  Musculoskeletal:  Positive for arthralgias.  Skin:  Positive for wound.     Physical Exam Vital Signs  I have reviewed the triage vital signs BP (!) 150/88 (BP Location: Right Arm)   Pulse 90   Temp 98 F (36.7 C) (Oral)   Resp 20   Ht 5\' 8"  (1.727 m)   Wt 109.3 kg   LMP 02/22/2023   SpO2 100%   BMI 36.64 kg/m   Physical Exam Vitals and nursing note reviewed.  Constitutional:      General: She is not in acute distress.    Appearance: She is well-developed.  HENT:     Head: Normocephalic and atraumatic.  Eyes:     Conjunctiva/sclera: Conjunctivae normal.  Cardiovascular:     Rate and Rhythm: Normal rate and regular rhythm.     Heart sounds: No murmur heard. Pulmonary:     Effort: Pulmonary effort is normal. No respiratory distress.     Breath sounds: Normal breath sounds.  Abdominal:     Palpations: Abdomen is soft.     Tenderness: There is no abdominal tenderness.  Musculoskeletal:        General: No swelling.     Cervical back: Neck supple.  Skin:    General: Skin is warm and dry.     Capillary Refill: Capillary refill takes less than 2 seconds.     Findings: Lesion present.  Neurological:     Mental Status: She is alert.  Psychiatric:        Mood and Affect: Mood normal.     ED Results and Treatments Labs (all labs ordered are listed, but only abnormal results are displayed) Labs Reviewed - No data to display                                                                                                                        Radiology No results found.  Pertinent labs & imaging results that were available during my care of the patient were reviewed by me and considered in my medical decision making (see MDM for details).  Medications Ordered in ED Medications  ketorolac (TORADOL) 15 MG/ML injection 15 mg (has no administration in time range)  doxycycline (VIBRA-TABS) tablet 100 mg (has no administration in time range)  Procedures Procedures  (including critical care time)  Medical Decision Making / ED Course   This patient presents to the ED for concern of dog bite, this involves an extensive number of treatment options, and is a complaint that carries with it a high risk of complications and morbidity.  The differential diagnosis includes fracture, puncture wound, local skin infection, retained foreign body  MDM: Patient seen emergency room for evaluation of a dog bite.  Physical exam with 2 puncture wounds of the right wrist and tenderness over bilateral wrists.  X-ray imaging reassuringly negative for fracture.  Wounds cleaned extensively with pressure washed saline and patient will be covered with antibiotics.  Initially patient's chart states that she has a allergy to amoxicillin and thus we gave doxycycline.  However on reevaluation, she states she has tolerated Augmentin without difficulty and thus her outpatient antibiotics will be Augmentin.  Tetanus is up-to-date and not given here in the ER.  Patient discharged with outpatient follow-up.   Additional history obtained:  -External records from outside source obtained and reviewed including: Chart review including previous notes, labs, imaging, consultation notes      Imaging Studies ordered: I ordered imaging studies including x-ray wrists I independently visualized and interpreted imaging. I agree with the radiologist interpretation   Medicines ordered and prescription drug management: Meds ordered this encounter  Medications   ketorolac (TORADOL) 15 MG/ML injection 15 mg   doxycycline (VIBRA-TABS) tablet 100 mg    -I have reviewed the patients home medicines and have made adjustments as needed  Critical interventions none   Social Determinants of Health:  Factors impacting patients care include: none   Reevaluation: After the interventions noted above, I reevaluated the patient and found that they  have :improved  Co morbidities that complicate the patient evaluation  Past Medical History:  Diagnosis Date   Allergy    Migraines    MRSA cellulitis of left foot    L foot   Panic attacks    Vertigo       Dispostion: I considered admission for this patient, but at this time she does not meet inpatient criteria for admission and will be discharged with outpatient follow-up     Final Clinical Impression(s) / ED Diagnoses Final diagnoses:  None     @PCDICTATION @    Ziaire Bieser, Wyn Forster, MD 02/25/23 [phone]

## 2023-02-25 NOTE — ED Triage Notes (Signed)
Pt was bit by her sisters dog (pit bull). Has his shots. Bit the right wrist and left wrist as well.

## 2023-03-05 ENCOUNTER — Emergency Department (HOSPITAL_COMMUNITY)
Admission: EM | Admit: 2023-03-05 | Discharge: 2023-03-05 | Disposition: A | Discharge status: Home / Self Care | Payer: BC Managed Care – PPO | Attending: Emergency Medicine | Admitting: Emergency Medicine

## 2023-03-05 ENCOUNTER — Other Ambulatory Visit: Payer: Self-pay

## 2023-03-05 ENCOUNTER — Encounter (HOSPITAL_COMMUNITY): Payer: Self-pay

## 2023-03-05 DIAGNOSIS — L03113 Cellulitis of right upper limb: Secondary | ICD-10-CM | POA: Diagnosis not present

## 2023-03-05 DIAGNOSIS — Z4800 Encounter for change or removal of nonsurgical wound dressing: Secondary | ICD-10-CM | POA: Diagnosis not present

## 2023-03-05 DIAGNOSIS — R030 Elevated blood-pressure reading, without diagnosis of hypertension: Secondary | ICD-10-CM | POA: Insufficient documentation

## 2023-03-05 DIAGNOSIS — Z91040 Latex allergy status: Secondary | ICD-10-CM | POA: Insufficient documentation

## 2023-03-05 DIAGNOSIS — M7989 Other specified soft tissue disorders: Secondary | ICD-10-CM | POA: Diagnosis not present

## 2023-03-05 DIAGNOSIS — I10 Essential (primary) hypertension: Secondary | ICD-10-CM | POA: Diagnosis not present

## 2023-03-05 LAB — CBC WITH DIFFERENTIAL/PLATELET
Abs Immature Granulocytes: 0.02 K/uL (ref 0.00–0.07)
Basophils Absolute: 0.1 K/uL (ref 0.0–0.1)
Basophils Relative: 1 %
Eosinophils Absolute: 0.4 K/uL (ref 0.0–0.5)
Eosinophils Relative: 5 %
HCT: 38.2 % (ref 36.0–46.0)
Hemoglobin: 11.4 g/dL — ABNORMAL LOW (ref 12.0–15.0)
Immature Granulocytes: 0 %
Lymphocytes Relative: 31 %
Lymphs Abs: 2.3 K/uL (ref 0.7–4.0)
MCH: 23.6 pg — ABNORMAL LOW (ref 26.0–34.0)
MCHC: 29.8 g/dL — ABNORMAL LOW (ref 30.0–36.0)
MCV: 78.9 fL — ABNORMAL LOW (ref 80.0–100.0)
Monocytes Absolute: 0.4 K/uL (ref 0.1–1.0)
Monocytes Relative: 5 %
Neutro Abs: 4.4 K/uL (ref 1.7–7.7)
Neutrophils Relative %: 58 %
Platelets: 423 K/uL — ABNORMAL HIGH (ref 150–400)
RBC: 4.84 MIL/uL (ref 3.87–5.11)
RDW: 14.2 % (ref 11.5–15.5)
WBC: 7.5 K/uL (ref 4.0–10.5)
nRBC: 0 % (ref 0.0–0.2)

## 2023-03-05 LAB — BASIC METABOLIC PANEL WITH GFR
Anion gap: 7 (ref 5–15)
BUN: 12 mg/dL (ref 6–20)
CO2: 24 mmol/L (ref 22–32)
Calcium: 9.1 mg/dL (ref 8.9–10.3)
Chloride: 106 mmol/L (ref 98–111)
Creatinine, Ser: 0.94 mg/dL (ref 0.44–1.00)
GFR, Estimated: >60 mL/min
Glucose, Bld: 96 mg/dL (ref 70–99)
Potassium: 4.5 mmol/L (ref 3.5–5.1)
Sodium: 137 mmol/L (ref 135–145)

## 2023-03-05 MED ORDER — FLUCONAZOLE 200 MG PO TABS: 200.0000 mg | ORAL_TABLET | Freq: Every day | ORAL | 0 refills | Status: AC

## 2023-03-05 MED ORDER — OXYCODONE HCL 5 MG PO TABS
5.0000 mg | ORAL_TABLET | Freq: Once | ORAL | Status: AC
  Administered 2023-03-05: 5 mg via ORAL
  Filled 2023-03-05: qty 1

## 2023-03-05 MED ORDER — CLINDAMYCIN HCL 150 MG PO CAPS: 300.0000 mg | ORAL_CAPSULE | Freq: Four times a day (QID) | ORAL | 0 refills | Status: DC

## 2023-03-05 MED ORDER — FLUCONAZOLE 150 MG PO TABS
150.0000 mg | ORAL_TABLET | Freq: Once | ORAL | Status: AC
  Administered 2023-03-05: 150 mg via ORAL
  Filled 2023-03-05: qty 1

## 2023-03-05 MED ORDER — OXYCODONE HCL 5 MG PO TABS: 5.0000 mg | ORAL_TABLET | ORAL | 0 refills | Status: DC | PRN

## 2023-03-05 MED ORDER — CLINDAMYCIN HCL 300 MG PO CAPS
300.0000 mg | ORAL_CAPSULE | Freq: Once | ORAL | Status: AC
  Administered 2023-03-05: 300 mg via ORAL
  Filled 2023-03-05: qty 1

## 2023-03-05 NOTE — ED Triage Notes (Signed)
 Patient is here for evaluation of her right hand. Pt was seen about 8 days ago due to her sister's dog biting her in the hand. States she took the entire course of antibiotics with no relief. Reports weeping to the site, hardening to the top of the wrist and pain.

## 2023-03-05 NOTE — Discharge Instructions (Addendum)
 You were evaluated in the emergency room for wound check.  Your exam was consistent with cellulitis.  A prescription for clindamycin was sent into your pharmacy.  Please be sure to complete the full course of the antibiotics.  Short supply of oxycodone was additionally sent into your pharmacy.  Please avoid driving or operating heavy machinery while using this medication can cause drowsiness as well as nausea.  Would recommend taking Tylenol 1000 mg every 4-6 hours up to 3 times a day and/or ibuprofen 600 mg every 4-6 hours up to 3 times a day on a full stomach.  If you experience any new or worsening symptoms including worsening redness swelling pain extending up your arm, fevers or chills please return to the emergency room.  Your blood pressure was noted to be elevated during your visit.  Please follow-up with your primary care doctor to discuss further management.

## 2023-03-05 NOTE — ED Provider Notes (Signed)
 [place] EMERGENCY DEPARTMENT AT [facility] Provider Note   CSN: [id number] Arrival date & time: 03/05/23  1058     History  Chief Complaint  Patient presents with   Hand Problem    Jasmine Jennings is a 48 y.o. female who presents for wound check.  Patient was bitten by her sisters dog 8 days ago on her right wrist.  She was seen here the day of and discharged on a course of Augmentin.  She completed the full course yesterday however symptoms have only worsened.  She complains of swelling, pain, redness and weeping from the wounds on her wrist.  Denies any fevers or chills or vomiting.  She is not diabetic.  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  HPI    Past Medical History:  Diagnosis Date   Allergy    Migraines    MRSA cellulitis of left foot    L foot   Panic attacks    Vertigo      Home Medications Prior to Admission medications   Medication Sig Start Date End Date Taking? Authorizing Provider  clindamycin (CLEOCIN) 150 MG capsule Take 2 capsules (300 mg total) by mouth every 6 (six) hours. 03/05/23  Yes Halford Decamp, PA-C  oxyCODONE (ROXICODONE) 5 MG immediate release tablet Take 1 tablet (5 mg total) by mouth every 4 (four) hours as needed for severe pain (pain score 7-10). 03/05/23  Yes Halford Decamp, PA-C  amoxicillin-clavulanate (AUGMENTIN) 875-125 MG tablet Take 1 tablet by mouth every 12 (twelve) hours. 02/25/23   Kommor, Madison, MD  APPLE CIDER VINEGAR PO Take 1 tablet by mouth 2 (two) times daily.    [provider]  Ascorbic Acid (VITAMIN C) 1000 MG tablet Take 1,000 mg by mouth daily.    [provider]  buPROPion ER (WELLBUTRIN SR) 100 MG 12 hr tablet Take 100 mg by mouth every morning. 03/16/20   [provider]  Butalbital-APAP-Caffeine 50-300-40 MG CAPS Take 1 tablet by mouth every 4 (four) hours as needed (migraine). 12/10/14   [provider]  clonazePAM (KLONOPIN) 0.5 MG tablet Take 0.5 mg by mouth  daily as needed for anxiety. 08/29/20   [provider]  cyclobenzaprine (FLEXERIL) 10 MG tablet Take 0.5-1 tablets (5-10 mg total) by mouth 3 (three) times daily as needed. 02/15/23   Margaretann Loveless, PA-C  dicyclomine (BENTYL) 20 MG tablet Take 1 tablet (20 mg total) by mouth 2 (two) times daily for 5 days. 11/27/21 12/02/21  Claude Manges, PA-C  escitalopram (LEXAPRO) 20 MG tablet Take 20 mg by mouth daily.    [provider]  fluticasone (FLONASE) 50 MCG/ACT nasal spray Place 1 spray into both nostrils daily as needed for allergies or rhinitis.    [provider]  gabapentin (NEURONTIN) 300 MG capsule Take 300 mg by mouth daily as needed (pain). 03/19/19   [provider]  loratadine (CLARITIN) 10 MG tablet Take 10 mg by mouth daily.    [provider]  Multiple Vitamin (MULTIVITAMIN WITH MINERALS) TABS tablet Take 1 tablet by mouth daily.    [provider]  naproxen (NAPROSYN) 500 MG tablet Take 1 tablet (500 mg total) by mouth 2 (two) times daily with a meal. 02/15/23   Burnette, Alessandra Bevels, PA-C  zolpidem (AMBIEN CR) 12.5 MG CR tablet Take 12.5 mg by mouth daily as needed for sleep. 12/25/14   [provider]      Allergies  Amoxicillin, Ciprofibrate, Doxycycline, Latex, Shellfish allergy, Tape, Yeast-derived drug products, and Ciprofloxacin    Review of Systems   Review of Systems  Skin:  Positive for wound.    Physical Exam Updated Vital Signs BP (!) 145/91 (BP Location: Right Arm)   Pulse 73   Temp 97.7 F (36.5 C) (Oral)   Resp 18   Ht 5\' 8"  (1.727 m)   Wt 109.3 kg   LMP 02/22/2023   SpO2 98%   BMI 36.64 kg/m  Physical Exam Vitals and nursing note reviewed.  Constitutional:      General: She is not in acute distress.    Appearance: She is well-developed.  HENT:     Head: Normocephalic and atraumatic.  Eyes:     Conjunctiva/sclera: Conjunctivae normal.  Cardiovascular:     Pulses: Normal pulses.      Heart sounds: No murmur heard. Pulmonary:     Effort: Pulmonary effort is normal. No respiratory distress.  Musculoskeletal:     Cervical back: Neck supple.     Comments: On exam patient has 2 dorsal and volar bite wounds.  There is surrounding erythema, warmth and small blistering.  There is no current drainage or significant induration or fluctuance.  The wrist is swollen and tender.  She tolerates gentle range of motion of wrist, she is capable of making full fist.  Radial pulses are symmetric.  NVI, cap refill less than 2 secs  Skin:    General: Skin is warm and dry.     Capillary Refill: Capillary refill takes less than 2 seconds.  Neurological:     Mental Status: She is alert.  Psychiatric:        Mood and Affect: Mood normal.        ED Results / Procedures / Treatments   Labs POCUS stenting all labs ordered are listed, but only abnormal results are displayed) Labs Reviewed  CBC WITH DIFFERENTIAL/PLATELET - Abnormal; Notable for the following components:      Result Value   Hemoglobin 11.4 (*)    MCV 78.9 (*)    MCH 23.6 (*)    MCHC 29.8 (*)    Platelets 423 (*)    All other components within normal limits  BASIC METABOLIC PANEL    EKG None  Radiology No results found.  Procedures Procedures    Medications Ordered in ED Medications  clindamycin (CLEOCIN) capsule 300 mg (has no administration in time range)  oxyCODONE (Oxy IR/ROXICODONE) immediate release tablet 5 mg (has no administration in time range)  fluconazole (DIFLUCAN) tablet 150 mg (has no administration in time range)    ED Course/ Medical Decision Making/ A&P Clinical Course as of 03/05/23 1328  Fri Mar 05, 2023  1237 This is a 48-year female presenting to the ED for follow-up visit after dog bite about 8 days ago.  This was a vaccinated dog that bit her on the right forearm, for which she was seen in the ED, had x-rays with no visible retained foreign body, and was prescribed a full 7 days of  Augmentin.  She completed this course of antibiotics but reports that the redness has not improved, although not extended much further from the original boundaries.  She has some small blistering now near the puncture sites.  She does have a history she reports of MRSA cellulitis of the lower leg.  I cannot palpate an abscess; she has excellent range of motion of the wrist, very low suspicion for septic joint.  Do not  see signs of tenosynovitis.  I suspect this is cellulitis which may be related to MRSA.  This may not have been adequately treated with Augmentin.  The patient has very poor tolerance to doxycycline due to GI upset, but does not have any known issues with clindamycin.  This may be an oral outpatient option, versus IV vancomycin as an inpatient [MT]  1310 White blood cell count normal [MT]    Clinical Course User Index [MT] Trifan, Kermit Balo, MD                                 Medical Decision Making  This patient presents to the ED with chief complaint(s) of bite wound .  The complaint involves an extensive differential diagnosis and also carries with it a high risk of complications and morbidity.   pertinent past medical history as listed in HPI  The differential diagnosis includes  Cellulitis, septic joint, tenosynovitis, osteomyelitis, abscess, necrotizing fasciitis. The initial plan is to  Will start with basic labs Additional history obtained: Records reviewed previous admission documents and Care Everywhere/External Records  Initial Assessment:   Patient presents hypertensive to 160/90 with complaints of worsening bite wound despite completing full course of Augmentin.  On exam she has 2 dorsal and volar plate wounds on her wrist with surrounding erythema, warmth, swelling and tenderness.  There is no significant induration, fluctuance or crepitus.  No obvious fluid collection on POCUS.  Compartments are soft, do not suspect compartment syndrome.  She is able to range her  wrist, do not suspect septic joint, she is capable of making a full fist without difficulty, do not suspect tenosynovitis.  Will start with basic labs.    Independent ECG interpretation:  none  Independent labs interpretation:  The following labs were independently interpreted:  CBC without leukocytosis  Independent visualization and interpretation of imaging: I independently visualized the following imaging with scope of interpretation limited to determining acute life threatening conditions related to emergency care: Reviewed wrist x-ray from initial visit on 2/20 which revealed no acute abnormality or retained foreign body  Treatment and Reassessment: Patient given clindamycin 300 mg, oxycodone 5 mg and Diflucan 100 mg  Consultations obtained:   none  Disposition:   The patient has been appropriately medically screened and/or stabilized in the ED. I have low suspicion for any other emergent medical condition which would require further screening, evaluation or treatment in the ED or require inpatient management. At time of discharge the patient is hemodynamically stable and in no acute distress. I have discussed work-up results and diagnosis with patient and answered all questions. Patient is agreeable with discharge plan. We discussed strict return precautions for returning to the emergency department and they verbalized understanding.     Social Determinants of Health:   none  This note was dictated with voice recognition software.  Despite best efforts at proofreading, errors may have occurred which can change the documentation meaning.          Final Clinical Impression(s) / ED Diagnoses Final diagnoses:  Cellulitis of right wrist  Elevated blood pressure reading    Rx / DC Orders ED Discharge Orders          Ordered    clindamycin (CLEOCIN) 150 MG capsule  Every 6 hours        03/05/23 1317    oxyCODONE (ROXICODONE) 5 MG immediate release tablet  Every 4 hours  PRN  03/05/23 1317              Halford Decamp, PA-C 03/05/23 1328    Terald Sleeper, MD 03/05/23 1420

## 2023-05-17 ENCOUNTER — Encounter (HOSPITAL_COMMUNITY): Payer: Self-pay | Admitting: Emergency Medicine

## 2023-05-17 ENCOUNTER — Ambulatory Visit (HOSPITAL_COMMUNITY)
Admission: EM | Admit: 2023-05-17 | Discharge: 2023-05-17 | Disposition: A | Discharge status: Home / Self Care | Payer: Self-pay

## 2023-05-17 DIAGNOSIS — Z23 Encounter for immunization: Secondary | ICD-10-CM

## 2023-05-17 DIAGNOSIS — Z8614 Personal history of Methicillin resistant Staphylococcus aureus infection: Secondary | ICD-10-CM

## 2023-05-17 DIAGNOSIS — T148XXA Other injury of unspecified body region, initial encounter: Secondary | ICD-10-CM

## 2023-05-17 DIAGNOSIS — L03113 Cellulitis of right upper limb: Secondary | ICD-10-CM

## 2023-05-17 HISTORY — DX: Pure hypercholesterolemia, unspecified: E78.00

## 2023-05-17 MED ORDER — TETANUS-DIPHTH-ACELL PERTUSSIS 5-2.5-18.5 LF-MCG/0.5 IM SUSY
PREFILLED_SYRINGE | INTRAMUSCULAR | Status: AC
  Filled 2023-05-17: qty 0.5

## 2023-05-17 MED ORDER — HYDROCODONE-ACETAMINOPHEN 5-325 MG PO TABS: 0.5000 | ORAL_TABLET | Freq: Two times a day (BID) | ORAL | 0 refills | Status: AC | PRN

## 2023-05-17 MED ORDER — SULFAMETHOXAZOLE-TRIMETHOPRIM 800-160 MG PO TABS: 1.0000 | ORAL_TABLET | Freq: Two times a day (BID) | ORAL | 0 refills | Status: AC

## 2023-05-17 MED ORDER — TETANUS-DIPHTH-ACELL PERTUSSIS 5-2.5-18.5 LF-MCG/0.5 IM SUSY
0.5000 mL | PREFILLED_SYRINGE | Freq: Once | INTRAMUSCULAR | Status: AC
  Administered 2023-05-17: 0.5 mL via INTRAMUSCULAR

## 2023-05-17 MED ORDER — FLUCONAZOLE 150 MG PO TABS: 150.0000 mg | ORAL_TABLET | ORAL | 0 refills | Status: DC

## 2023-05-17 MED ORDER — CEPHALEXIN 500 MG PO CAPS: 500.0000 mg | ORAL_CAPSULE | Freq: Three times a day (TID) | ORAL | 0 refills | Status: DC

## 2023-05-17 NOTE — ED Provider Notes (Signed)
 [facility]    CSN: [id number] Arrival date & time: 05/17/23  0901      History   Chief Complaint Chief Complaint  Patient presents with   Hand Injury    HPI Jasmine Jennings is a 48 y.o. female.   Patient presents today with a 24-hour history of redness, pain, swelling of her right hand.  She reports that in February 2025 she was bitten by her dog and developed cellulitis with a similar presentation.  This had resolved but then a few days ago she had a small abrasion on the dorsal hand from when she was moving things at home and this became more painful and swollen.  She reports that the pain is rated 7 on a 0-10 pain scale, described as sharp, worse with flexion of fingers, no relieving factors identified.  She denies any numbness or paresthesias.  She has been cleaning the area with alcohol, soap, water, and applying topical antibiotics.  She is left-handed.  Reports that she was diagnosed with MRSA when she had cellulitis in the past.  She did not receive a tetanus vaccine when she sustained a dog bite is unsure when her last tetanus was.  She is having difficulty with her daily activities as result of the pain.  She is confident that she is not pregnant.    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      Home Medications    Prior to Admission medications   Medication Sig Start Date End Date Taking? Authorizing Provider  cephALEXin (KEFLEX) 500 MG capsule Take 1 capsule (500 mg total) by mouth 3 (three) times daily. 05/17/23  Yes Stepanie Graver K, PA-C  fluconazole  (DIFLUCAN ) 150 MG tablet Take 1 tablet (150 mg total) by mouth once a week. 05/17/23  Yes Laquiesha Piacente K, PA-C  HYDROcodone-acetaminophen (NORCO/VICODIN) 5-325 MG tablet Take 0.5-1 tablets by mouth 2 (two) times daily as needed for up to 2 days. 05/17/23 05/19/23 Yes Coryn Mosso K, PA-C  sulfamethoxazole -trimethoprim  (BACTRIM  DS) 800-160 MG tablet Take 1 tablet by mouth 2 (two) times daily for 7 days. 05/17/23 05/24/23 Yes Kobe Jansma K, PA-C  APPLE CIDER VINEGAR PO Take 1 tablet by mouth 2 (two) times daily.    [provider]  Ascorbic Acid  (VITAMIN C) 1000 MG tablet Take 1,000 mg by mouth daily.    [provider]  buPROPion  ER (WELLBUTRIN  SR) 100 MG 12 hr tablet Take 100 mg by mouth every morning. 03/16/20   [provider]  Butalbital -APAP-Caffeine  50-300-40 MG CAPS Take 1 tablet by mouth every 4 (four) hours as needed (migraine). 12/10/14   [provider]  clonazePAM  (KLONOPIN ) 0.5  MG tablet Take 0.5 mg by mouth daily as needed for anxiety. 08/29/20   [provider]  cyclobenzaprine  (FLEXERIL ) 10 MG tablet Take 0.5-1 tablets (5-10 mg total) by mouth 3 (three) times daily as needed. 02/15/23   Angelia Kelp, PA-C  dicyclomine  (BENTYL ) 20 MG tablet Take 1 tablet (20 mg total) by mouth 2 (two) times daily for 5 days. 11/27/21 12/02/21  Soto, Johana, PA-C  escitalopram  (LEXAPRO ) 20 MG tablet Take 20 mg by mouth daily.    [provider]  fluticasone  (FLONASE ) 50 MCG/ACT nasal spray Place 1 spray into both nostrils daily as needed for allergies or rhinitis.    [provider]  gabapentin  (NEURONTIN ) 300 MG capsule Take 300 mg by mouth daily as needed  (pain). 03/19/19   [provider]  loratadine  (CLARITIN ) 10 MG tablet Take 10 mg by mouth daily.    [provider]  Multiple Vitamin (MULTIVITAMIN WITH MINERALS) TABS tablet Take 1 tablet by mouth daily.    [provider]  rosuvastatin (CRESTOR) 10 MG tablet Take 10 mg by mouth daily.    [provider]  zolpidem  (AMBIEN  CR) 12.5 MG CR tablet Take 12.5 mg by mouth daily as needed for sleep. 12/25/14   [provider]    Family History Family History  Problem Relation Age of Onset   Hyperlipidemia Mother    Hypertension Mother    Diabetes Other     Social History Social History   Tobacco Use   Smoking status: Never   Smokeless tobacco: Never  Substance Use Topics   Alcohol use: No    Alcohol/week: 0.0 standard drinks of alcohol   Drug use: No     Allergies   Amoxicillin , Ciprofibrate, Doxycycline , Latex, Shellfish allergy, Tape, Yeast-derived drug products, and Ciprofloxacin   Review of Systems Review of Systems  Constitutional:  Positive for activity change. Negative for appetite change, fatigue and fever.  Gastrointestinal:  Negative for abdominal pain, diarrhea, nausea and vomiting.  Musculoskeletal:  Positive for arthralgias and myalgias.  Skin:  Positive for color change and wound.  Neurological:  Negative for weakness and numbness.     Physical Exam Triage Vital Signs ED Triage Vitals  Encounter Vitals Group     BP 05/17/23 0951 138/82     Systolic BP Percentile --      Diastolic BP Percentile --      Pulse Rate 05/17/23 0951 72     Resp 05/17/23 0951 15     Temp 05/17/23 0951 98.2 F (36.8 C)     Temp Source 05/17/23 0951 Oral     SpO2 05/17/23 0951 96 %     Weight --      Height --      Head Circumference --      Peak Flow --      Pain Score 05/17/23 0946 7     Pain Loc --      Pain Education --      Exclude from Growth Chart --    No data found.  Updated Vital Signs BP 138/82 (BP Location: Left  Arm)   Pulse 72   Temp 98.2 F (36.8 C) (Oral)   Resp 15   LMP 04/15/2023   SpO2 96%   Visual Acuity Right Eye Distance:   Left Eye Distance:   Bilateral Distance:    Right Eye Near:   Left Eye Near:    Bilateral Near:     Physical Exam Vitals reviewed.  Constitutional:      General: She is awake. She is not in acute distress.    Appearance: Normal appearance. She is well-developed. She is not ill-appearing.     Comments: Very pleasant female appears stated age in no acute distress sitting comfortably in exam room  HENT:     Head: Normocephalic and atraumatic.  Cardiovascular:     Rate and Rhythm: Normal rate and regular rhythm.     Heart sounds: Normal heart sounds, S1 normal and S2 normal. No murmur heard.    Comments: Capillary fill within 2 seconds right fingers Pulmonary:     Effort: Pulmonary effort is normal.     Breath sounds: Normal breath sounds. No wheezing, rhonchi or rales.     Comments: Clear to auscultation bilaterally Musculoskeletal:     Right hand: Tenderness present. No swelling or bony tenderness. Normal range of motion. There is no disruption of two-point discrimination. Normal capillary refill.     Comments: Right hand: 2.5 cm semicircular abrasion noted dorsal right hand with surrounding erythema.  Area is tender palpation and warm to touch.  No streaking or evidence of lymphangitis.  No bleeding or drainage noted.  Normal pincer grip strength.  Hand neurovascularly intact.  Psychiatric:        Behavior: Behavior is cooperative.      UC Treatments / Results  Labs (all labs ordered are listed, but only abnormal results are displayed) Labs Reviewed - No data to display  EKG   Radiology No results found.  Procedures Procedures (including critical care time)  Medications Ordered in UC Medications  Tdap (BOOSTRIX) injection 0.5 mL (0.5 mLs Intramuscular Given 05/17/23 1030)    Initial Impression / Assessment and Plan / UC Course  I have  reviewed the triage vital signs and the nursing notes.  Pertinent labs & imaging results that were available during my care of the patient were reviewed by me and considered in my medical decision making (see chart for details).     Patient is well-appearing, afebrile, nontoxic, nontachycardic.  Plain films were deferred as low suspicion for osteomyelitis given symptoms of only been present for a few days when she denies any recent trauma with full active range of motion of hand.  Will cover for cellulitis given clinical presentation.  She was started on cephalexin and Bactrim  DS due to history of MRSA.  Discussed that if she develops any rash or oral lesions she should stop the medication and be seen immediately.  No indication for dose adjustment of her antibiotics based on metabolic panel from 03/05/2023 with creatinine of 0.94 and calculated creatinine clearance of 128.52 mL/min.  Tetanus was updated today.  She does report antibiotic induced yeast infections and so was given 2 doses of Diflucan  should she have any symptoms.  She reports the pain is severe and she has been taking over-the-counter analgesics and been ineffective.  She was started on hydrocodone but we discussed that we cannot provide any refills of this medication and because it is both addictive and sedating she should limit use is much as possible.  She can use over-the-counter analgesics for additional pain relief.  We discussed that if her symptoms are not improving very quickly she should follow-up with hand specialist.  If anything worsens and she has increasing pain, swelling, numbness or tingling in the hand she needs to go to the ER immediately.  Return precautions given.  She declined work excuse note.  Final Clinical Impressions(s) / UC Diagnoses  Final diagnoses:  Cellulitis of right hand  Skin abrasion  History of MRSA infection     Discharge Instructions      We are treating you for cellulitis.  Start cephalexin  3 times daily for 1 week and Bactrim  DS twice daily for 1 week.  If you develop any rash or lesions stop the medication be seen immediately.  Keep the area clean with soap and water.  I have called in 2 doses of Diflucan  in case you develop any yeast infection symptoms.  Take half a tablet to 1 tablet of hydrocodone up to twice a day for 2 days.  You can use over-the-counter analgesics for additional pain relief.  This medication is addictive and sedating so please try to limit use as much as possible.  We updated your tetanus today.  If your symptoms are not improving quickly please follow-up with hand specialist; call to schedule an appointment.  If anything worsens you have increasing pain, swelling, numbness or tingling in your hand you need to be seen immediately.   ED Prescriptions     Medication Sig Dispense Auth. Provider   cephALEXin (KEFLEX) 500 MG capsule Take 1 capsule (500 mg total) by mouth 3 (three) times daily. 21 capsule Daniyal Tabor K, PA-C   sulfamethoxazole -trimethoprim  (BACTRIM  DS) 800-160 MG tablet Take 1 tablet by mouth 2 (two) times daily for 7 days. 14 tablet Jahkeem Kurka K, PA-C   fluconazole  (DIFLUCAN ) 150 MG tablet Take 1 tablet (150 mg total) by mouth once a week. 2 tablet Estephan Gallardo K, PA-C   HYDROcodone-acetaminophen (NORCO/VICODIN) 5-325 MG tablet Take 0.5-1 tablets by mouth 2 (two) times daily as needed for up to 2 days. 4 tablet Rakayla Ricklefs K, PA-C      I have reviewed the PDMP during this encounter.   Budd Cargo, PA-C 05/17/23 1031

## 2023-05-17 NOTE — ED Triage Notes (Signed)
 Pt reports had cellulitis after dog bite that was "not too long ago" and was on antibiotics.   Still had spot that could feel so ordered Silverdine off [employer] that was putting on the keloids. Cut her right hand yesterday. Cleaned it with peroxide, tee tree and triple antibiotic ointment. Reports fingers are now swollen. Hurts when squeezes her fingers.

## 2023-05-17 NOTE — Discharge Instructions (Signed)
 We are treating you for cellulitis.  Start cephalexin 3 times daily for 1 week and Bactrim  DS twice daily for 1 week.  If you develop any rash or lesions stop the medication be seen immediately.  Keep the area clean with soap and water.  I have called in 2 doses of Diflucan  in case you develop any yeast infection symptoms.  Take half a tablet to 1 tablet of hydrocodone up to twice a day for 2 days.  You can use over-the-counter analgesics for additional pain relief.  This medication is addictive and sedating so please try to limit use as much as possible.  We updated your tetanus today.  If your symptoms are not improving quickly please follow-up with hand specialist; call to schedule an appointment.  If anything worsens you have increasing pain, swelling, numbness or tingling in your hand you need to be seen immediately.

## 2023-09-12 ENCOUNTER — Telehealth: Admitting: Family

## 2023-09-12 DIAGNOSIS — L239 Allergic contact dermatitis, unspecified cause: Secondary | ICD-10-CM | POA: Diagnosis not present

## 2023-09-12 MED ORDER — TRIAMCINOLONE ACETONIDE 0.1 % EX CREA: 1.0000 | TOPICAL_CREAM | Freq: Two times a day (BID) | CUTANEOUS | 0 refills | Status: DC

## 2023-09-12 MED ORDER — PREDNISONE 10 MG (21) PO TBPK: ORAL_TABLET | ORAL | 0 refills | Status: DC

## 2023-09-12 NOTE — Progress Notes (Signed)
 E Visit for Rash  We are sorry that you are not feeling well. Here is how we plan to help!  Based on what you shared with me it looks like you have contact dermatitis.  Contact dermatitis is a skin rash caused by something that touches the skin and causes irritation or inflammation.  Your skin may be red, swollen, dry, cracked, and itch.  The rash should go away in a few days but can last a few weeks.  If you get a rash, it's important to figure out what caused it so the irritant can be avoided in the future. and I am prescribing triamcinolone  0.1 % cream -- apply to the affected area(s) in a thin layer, twice daily for up to 14 days. Do not apply to face, privates or armpit regions.   I recommend you take Benadryl  25 mg - 50 mg every 4 hours to control the symptoms but if they last over 24 hours it is best that you see an office based provider for follow up.    Prednisone  10 mg daily for 6 days (see taper instructions below)  Directions for 6 day taper: Day 1: 2 tablets before breakfast, 1 after both lunch & dinner and 2 at bedtime Day 2: 1 tab before breakfast, 1 after both lunch & dinner and 2 at bedtime Day 3: 1 tab at each meal & 1 at bedtime Day 4: 1 tab at breakfast, 1 at lunch, 1 at bedtime Day 5: 1 tab at breakfast & 1 tab at bedtime Day 6: 1 tab at breakfast     HOME CARE:  Take cool showers and avoid direct sunlight. Apply cool compress or wet dressings. Take a bath in an oatmeal bath.  Sprinkle content of one Aveeno packet under running faucet with comfortably warm water.  Bathe for 15-20 minutes, 1-2 times daily.  Pat dry with a towel. Do not rub the rash. Use hydrocortisone  cream. Take an antihistamine like Benadryl  for widespread rashes that itch.  The adult dose of Benadryl  is 25-50 mg by mouth 4 times daily. Caution:  This type of medication may cause sleepiness.  Do not drink alcohol, drive, or operate dangerous machinery while taking antihistamines.  Do not take these  medications if you have prostate enlargement.  Read package instructions thoroughly on all medications that you take.  GET HELP RIGHT AWAY IF:  Symptoms don't go away after treatment. Severe itching that persists. If you rash spreads or swells. If you rash begins to smell. If it blisters and opens or develops a yellow-brown crust. You develop a fever. You have a sore throat. You become short of breath.  MAKE SURE YOU:  Understand these instructions. Will watch your condition. Will get help right away if you are not doing well or get worse.  Thank you for choosing an e-visit.  Your e-visit answers were reviewed by a board certified advanced clinical practitioner to complete your personal care plan. Depending upon the condition, your plan could have included both over the counter or prescription medications.  Please review your pharmacy choice. Make sure the pharmacy is open so you can pick up prescription now. If there is a problem, you may contact your provider through [employer] and have the prescription routed to another pharmacy.  Your safety is important to us . If you have drug allergies check your prescription carefully.   For the next 24 hours you can use MyChart to ask questions about today's visit, request a non-urgent call  back, or ask for a work or school excuse. You will get an email in the next two days asking about your experience. I hope that your e-visit has been valuable and will speed your recovery.

## 2023-09-12 NOTE — Progress Notes (Signed)
Approximately 5 minutes was spent documenting and reviewing patient's chart.

## 2023-11-19 ENCOUNTER — Telehealth: Payer: Self-pay | Admitting: Physician Assistant

## 2023-11-19 DIAGNOSIS — M62830 Muscle spasm of back: Secondary | ICD-10-CM

## 2023-11-19 MED ORDER — CYCLOBENZAPRINE HCL 10 MG PO TABS: 5.0000 mg | ORAL_TABLET | Freq: Three times a day (TID) | ORAL | 0 refills | Status: AC | PRN

## 2023-11-19 MED ORDER — NAPROXEN 500 MG PO TABS: 500.0000 mg | ORAL_TABLET | Freq: Two times a day (BID) | ORAL | 0 refills | Status: AC

## 2023-11-19 NOTE — Progress Notes (Signed)

## 2024-01-23 ENCOUNTER — Telehealth: Payer: Self-pay | Admitting: Family

## 2024-01-23 DIAGNOSIS — R399 Unspecified symptoms and signs involving the genitourinary system: Secondary | ICD-10-CM

## 2024-01-23 MED ORDER — CEPHALEXIN 500 MG PO CAPS: 500.0000 mg | ORAL_CAPSULE | Freq: Two times a day (BID) | ORAL | 0 refills | Status: DC

## 2024-01-23 NOTE — Progress Notes (Signed)

## 2024-01-24 ENCOUNTER — Emergency Department (HOSPITAL_BASED_OUTPATIENT_CLINIC_OR_DEPARTMENT_OTHER)
Admission: EM | Admit: 2024-01-24 | Discharge: 2024-01-24 | Disposition: A | Discharge status: Home / Self Care | Payer: Self-pay | Attending: Emergency Medicine | Admitting: Emergency Medicine

## 2024-01-24 ENCOUNTER — Telehealth: Payer: Self-pay | Admitting: Physician Assistant

## 2024-01-24 ENCOUNTER — Other Ambulatory Visit: Payer: Self-pay

## 2024-01-24 DIAGNOSIS — Z91040 Latex allergy status: Secondary | ICD-10-CM | POA: Insufficient documentation

## 2024-01-24 DIAGNOSIS — I10 Essential (primary) hypertension: Secondary | ICD-10-CM | POA: Insufficient documentation

## 2024-01-24 DIAGNOSIS — Z79899 Other long term (current) drug therapy: Secondary | ICD-10-CM | POA: Insufficient documentation

## 2024-01-24 DIAGNOSIS — L03211 Cellulitis of face: Secondary | ICD-10-CM

## 2024-01-24 MED ORDER — MUPIROCIN CALCIUM 2 % EX CREA: 1.0000 | TOPICAL_CREAM | Freq: Two times a day (BID) | CUTANEOUS | 0 refills | Status: AC

## 2024-01-24 MED ORDER — HYDROXYZINE HCL 25 MG PO TABS: 25.0000 mg | ORAL_TABLET | Freq: Once | ORAL | Status: DC

## 2024-01-24 MED ORDER — SULFAMETHOXAZOLE-TRIMETHOPRIM 800-160 MG PO TABS: 1.0000 | ORAL_TABLET | Freq: Once | ORAL | Status: DC

## 2024-01-24 MED ORDER — SULFAMETHOXAZOLE-TRIMETHOPRIM 800-160 MG PO TABS: 1.0000 | ORAL_TABLET | Freq: Two times a day (BID) | ORAL | 0 refills | Status: AC

## 2024-01-24 MED ORDER — FLUCONAZOLE 150 MG PO TABS: 150.0000 mg | ORAL_TABLET | Freq: Every day | ORAL | 0 refills | Status: AC

## 2024-01-24 MED ORDER — HYDROXYZINE HCL 25 MG PO TABS: 25.0000 mg | ORAL_TABLET | Freq: Four times a day (QID) | ORAL | 0 refills | Status: AC

## 2024-01-24 NOTE — Progress Notes (Signed)
" °  Because of worsening facial rash with swelling despite being on an antibiotic (I know for a UTI, but Keflex  is first-line for cellulitis as well), I feel your condition warrants further evaluation and I recommend that you be seen in a face-to-face visit. You may require a wound culture to rule out resistant bacteria.    NOTE: There will be NO CHARGE for this E-Visit   If you are having a true medical emergency, please call 911.     For an urgent face to face visit, [place] has multiple urgent care centers for your convenience.  Click the link below for the full list of locations and hours, walk-in wait times, appointment scheduling options and driving directions:  Urgent Care - [place], [place], [place], [place], [place], [place]  [place]     Your MyChart E-visit questionnaire answers were reviewed by a board certified advanced clinical practitioner to complete your personal care plan based on your specific symptoms.    Thank you for using e-Visits.    "

## 2024-01-24 NOTE — ED Triage Notes (Signed)
 Pt reports she busted a pimple about x4 days ago, was using tea tree oil, alcohol, and peroxide on the area, but noticed increased swelling and redness to lips, chin, and throat the past few days. Reports itching and some chest pain, reports she thinks CP may be related to anxiety. Is currently taking keflex  d/t UTI also. Had E-Visit this morning that recommended to go to UC and UC recommended to be seen in ER. 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

## 2024-01-24 NOTE — Discharge Instructions (Addendum)
 I would recommend continuing the Keflex  prescribed to you.  Start Bactrim .  You can take Atarax  as needed for itching.  I have sent Diflucan  to your pharmacy which you can take as needed if yeast infection symptoms occur.  Take 1 pill and if symptoms continue can repeat in 72 hours.  If you develop worsening pain, fever or eye involvement please return to the ED.  If symptoms have not started to improve after 48 hours please return to ED.

## 2024-01-24 NOTE — ED Provider Notes (Signed)
 " [place] EMERGENCY DEPARTMENT AT [facility] Provider Note   CSN: [id number] Arrival date & time: 01/24/24  1051     Patient presents with: Skin Problem   Jasmine Jennings is a 49 y.o. female patient with past medical history of MRSA, hypertension, hyperlipidemia presents to emergency room with complaint of facial cellulitis.  She reports that 4 days ago she popped a pimple to the right side of her chin.  Since then she has had surrounding redness, skin irritation and mild swelling and itching over this area.  She denies any swelling over her eyes or pain with eye range of motion.  No fever.  No difficulty swallowing or handling secretions, no dental pain.  Patient was recently diagnosed with a UTI and started on Keflex .  She has taken 2 doses of Keflex .  UTI symptoms have resolved.   HPI     Prior to Admission medications  Medication Sig Start Date End Date Taking? Authorizing Provider  Ascorbic Acid  (VITAMIN C) 1000 MG tablet Take 1,000 mg by mouth daily.    [provider]  Butalbital -APAP-Caffeine  50-300-40 MG CAPS Take 1 tablet by mouth every 4 (four) hours as needed (migraine). 12/10/14   [provider]  cephALEXin  (KEFLEX ) 500 MG capsule Take 1 capsule (500 mg total) by mouth 2 (two) times daily. 01/23/24   Lavell Bari LABOR, FNP  clonazePAM  (KLONOPIN ) 0.5 MG tablet Take 0.5 mg by mouth daily as needed for anxiety. 08/29/20   [provider]  cyclobenzaprine  (FLEXERIL ) 10 MG tablet Take 0.5-1 tablets (5-10 mg total) by mouth 3 (three) times daily as needed. 11/19/23   Vivienne Delon CHRISTELLA, PA-C  escitalopram  (LEXAPRO ) 20 MG tablet Take 20 mg by mouth daily.    [provider]  fluticasone  (FLONASE ) 50 MCG/ACT nasal spray Place 1 spray into both nostrils daily as needed for allergies or rhinitis.    [provider]  gabapentin  (NEURONTIN ) 300 MG capsule Take 300 mg by mouth daily as needed (pain). 03/19/19   [provider]  loratadine  (CLARITIN ) 10 MG tablet Take 10 mg by mouth daily.    [provider]  Multiple Vitamin (MULTIVITAMIN WITH MINERALS) TABS tablet Take 1 tablet by mouth daily.    [provider]  naproxen  (NAPROSYN ) 500 MG tablet Take 1 tablet (500 mg total) by mouth 2 (two) times daily with a meal. 11/19/23   Burnette, Delon CHRISTELLA, PA-C  rosuvastatin (CRESTOR) 10 MG tablet Take 10 mg by mouth daily.    [provider]  zolpidem  (AMBIEN  CR) 12.5 MG CR tablet Take 12.5 mg by mouth daily as needed for sleep. 12/25/14   [provider]    Allergies: Ciprofibrate, Doxycycline , Latex, Shellfish allergy, Tape, Yeast-derived drug products, and Ciprofloxacin    Review of Systems  Skin:  Positive for rash.    Updated Vital Signs BP (!) 137/94   Pulse 91   Temp 98.6 F (37 C) (Oral)   Resp 16   LMP 12/25/2023 (Approximate)   SpO2 94%   Physical Exam Vitals and nursing note reviewed.  Constitutional:      General: She is not in acute distress.    Appearance: She is not toxic-appearing.  HENT:     Head: Normocephalic and atraumatic.      Comments: Patient has mild erythema, dry crusting, swelling to right lower side of her face primarily over the right side of her chin.  No anterior neck swelling.  Normal phonation, handling secretions. Eyes:  General: No scleral icterus.    Conjunctiva/sclera: Conjunctivae normal.  Cardiovascular:     Rate and Rhythm: Normal rate and regular rhythm.     Pulses: Normal pulses.     Heart sounds: Normal heart sounds.  Pulmonary:     Effort: Pulmonary effort is normal. No respiratory distress.     Breath sounds: Normal breath sounds.  Abdominal:     General: Abdomen is flat. Bowel sounds are normal.     Palpations: Abdomen is soft.     Tenderness: There is no abdominal tenderness.  Musculoskeletal:     Right lower leg: No edema.     Left lower leg: No edema.  Skin:    General: Skin is warm and dry.     Findings:  No lesion.  Neurological:     General: No focal deficit present.     Mental Status: She is alert and oriented to person, place, and time. Mental status is at baseline.     (all labs ordered are listed, but only abnormal results are displayed) Labs Reviewed - No data to display  EKG: None  Radiology: No results found.   Procedures   Medications Ordered in the ED  hydrOXYzine  (ATARAX ) tablet 25 mg (has no administration in time range)  sulfamethoxazole -trimethoprim  (BACTRIM  DS) 800-160 MG per tablet 1 tablet (has no administration in time range)                                    Medical Decision Making Risk Prescription drug management.   This patient presents to the ED for concern of facial swelling, this involves an extensive number of treatment options, and is a complaint that carries with it a high risk of complications and morbidity.  The differential diagnosis includes cellulitis, allergic reaction, deep space infection, orbital cellulitis    Cardiac Monitoring: / EKG:  The patient was maintained on a cardiac monitor.     Problem List / ED Course / Critical interventions / Medication management  Presents with 4 days of facial rash.  This is primarily involving the right side of her chin and cheek.  On arrival she is hemodynamically stable and she is well-appearing.  No sign of sepsis.  This started after a pimple/skin irritation.  She has no significant tenderness over the area or induration.  No area of fluctuance or sign of abscess or deep space infection.  She has no orbital involvement.  Her pain is well-controlled and she is tolerating oral intake. I ordered medication including Bactrim , Atarax  Reevaluation of the patient after these medicines showed that the patient improved I have reviewed the patients home medicines and have made adjustments as needed. Patient will continue Keflex  (has taken only 2 doses/ 1 day at this point) and start Bactrim  for facial  cellulitis.  She will use Atarax  as needed for itching.  Patient has no sign of deep space infection or sepsis. Feel stable for discharge with outpatient follow-up.  Given strict return precautions.        Final diagnoses:  Facial cellulitis    ED Discharge Orders          Ordered    hydrOXYzine  (ATARAX ) 25 MG tablet  Every 6 hours        01/24/24 1208    sulfamethoxazole -trimethoprim  (BACTRIM  DS) 800-160 MG tablet  2 times daily        01/24/24 1208    mupirocin   cream (BACTROBAN ) 2 %  2 times daily        01/24/24 1208    fluconazole  (DIFLUCAN ) 150 MG tablet  Daily        01/24/24 1208               Devonta Blanford, Warren SAILOR, PA-C 01/24/24 1305    Jerrol Agent, MD 01/24/24 1315  "

## 2024-02-02 ENCOUNTER — Telehealth: Payer: Self-pay | Admitting: Nurse Practitioner

## 2024-02-02 DIAGNOSIS — L03211 Cellulitis of face: Secondary | ICD-10-CM

## 2024-02-02 NOTE — Progress Notes (Signed)
 I am so sorry you're no feeling well.   Because of ongoing symptoms despite recent treatment with antifungal and antibiotic, I feel your condition warrants further evaluation and I recommend that you be seen in a face-to-face visit. You can report to an urgent care instead of the Emergency Room for evaluation. See the link below for our Urgent Care locations.    NOTE: There will be NO CHARGE for this E-Visit   If you are having a true medical emergency, please call 911.     For an urgent face to face visit, [place] has multiple urgent care centers for your convenience.  Click the link below for the full list of locations and hours, walk-in wait times, appointment scheduling options and driving directions:  Urgent Care - [place], [place], [place], [place], [place], [place]  [place]     Your MyChart E-visit questionnaire answers were reviewed by a board certified advanced clinical practitioner to complete your personal care plan based on your specific symptoms.    Thank you for using e-Visits.

## 2024-02-07 ENCOUNTER — Other Ambulatory Visit: Payer: Self-pay

## 2024-02-07 ENCOUNTER — Emergency Department (HOSPITAL_BASED_OUTPATIENT_CLINIC_OR_DEPARTMENT_OTHER)
Admission: EM | Admit: 2024-02-07 | Discharge: 2024-02-08 | Disposition: A | Payer: Self-pay | Attending: Emergency Medicine | Admitting: Emergency Medicine

## 2024-02-07 DIAGNOSIS — L03211 Cellulitis of face: Secondary | ICD-10-CM | POA: Insufficient documentation

## 2024-02-07 DIAGNOSIS — Z91040 Latex allergy status: Secondary | ICD-10-CM | POA: Insufficient documentation

## 2024-02-08 MED ORDER — DOXYCYCLINE HYCLATE 100 MG PO CAPS: 100.0000 mg | ORAL_CAPSULE | Freq: Two times a day (BID) | ORAL | 0 refills | Status: AC

## 2024-02-08 MED ORDER — FLUCONAZOLE 150 MG PO TABS: 150.0000 mg | ORAL_TABLET | Freq: Once | ORAL | 0 refills | Status: AC

## 2024-02-08 MED ORDER — ONDANSETRON 4 MG PO TBDP
4.0000 mg | ORAL_TABLET | Freq: Once | ORAL | Status: AC
  Administered 2024-02-08: 4 mg via ORAL
  Filled 2024-02-08: qty 1

## 2024-02-08 MED ORDER — DOXYCYCLINE HYCLATE 100 MG PO TABS
100.0000 mg | ORAL_TABLET | Freq: Once | ORAL | Status: AC
  Administered 2024-02-08: 100 mg via ORAL
  Filled 2024-02-08: qty 1

## 2024-02-08 MED ORDER — ONDANSETRON 4 MG PO TBDP: 4.0000 mg | ORAL_TABLET | Freq: Three times a day (TID) | ORAL | 0 refills | Status: AC | PRN
